# Patient Record
Sex: Male | Born: 1940 | Race: White | Hispanic: No | Marital: Married | Smoking: Never smoker
Health system: Southern US, Community
[De-identification: ages and names within clinical notes are randomized; demographics above are authoritative.]

## PROBLEM LIST (undated history)

## (undated) DIAGNOSIS — E039 Hypothyroidism, unspecified: Secondary | ICD-10-CM

## (undated) DIAGNOSIS — I10 Essential (primary) hypertension: Secondary | ICD-10-CM

## (undated) HISTORY — PX: THYROIDECTOMY, PARTIAL: SHX18

## (undated) HISTORY — DX: Essential (primary) hypertension: I10

## (undated) HISTORY — DX: Hypothyroidism, unspecified: E03.9

---

## 2015-07-15 DIAGNOSIS — N433 Hydrocele, unspecified: Secondary | ICD-10-CM | POA: Insufficient documentation

## 2016-07-16 DIAGNOSIS — K922 Gastrointestinal hemorrhage, unspecified: Secondary | ICD-10-CM | POA: Insufficient documentation

## 2016-07-16 DIAGNOSIS — N401 Enlarged prostate with lower urinary tract symptoms: Secondary | ICD-10-CM | POA: Insufficient documentation

## 2017-07-18 DIAGNOSIS — D696 Thrombocytopenia, unspecified: Secondary | ICD-10-CM | POA: Insufficient documentation

## 2018-08-13 DIAGNOSIS — I351 Nonrheumatic aortic (valve) insufficiency: Secondary | ICD-10-CM | POA: Insufficient documentation

## 2019-09-15 DIAGNOSIS — K219 Gastro-esophageal reflux disease without esophagitis: Secondary | ICD-10-CM | POA: Insufficient documentation

## 2019-09-15 DIAGNOSIS — K5981 Ogilvie syndrome: Secondary | ICD-10-CM | POA: Insufficient documentation

## 2019-11-07 DIAGNOSIS — R911 Solitary pulmonary nodule: Secondary | ICD-10-CM | POA: Insufficient documentation

## 2020-07-29 DIAGNOSIS — E785 Hyperlipidemia, unspecified: Secondary | ICD-10-CM | POA: Insufficient documentation

## 2020-07-29 DIAGNOSIS — I10 Essential (primary) hypertension: Secondary | ICD-10-CM | POA: Insufficient documentation

## 2021-02-20 NOTE — Progress Notes (Signed)
[facility] [address] [place] [address] Phone: [phone] Subjective:    [name], am serving as a scribe for Dr. Antoine Primas. This visit occurred during the SARS-CoV-2 public health emergency.  Safety protocols were in place, including screening questions prior to the visit, additional usage of staff PPE, and extensive cleaning of exam room while observing appropriate contact time as indicated for disinfecting solutions.   CC: low back pain   ION:[id number]  Collin Barnes is a 81 y.o. male coming in with complaint of back pain. Patient states that he had shooting pain in right side down to foot a month ago after washing a car. Went into UC and and got prednisone and Tramadol. Did some physical therapy before moving here one week ago. Pain has improved. Is going to go more PT when he leaves for [place] later this week.  Patient does have images on his phone of the MRI.  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  This is consistent with the report patient gives me as well.  Dated from December 2022.      Social History   Socioeconomic History   Marital status: Married    Spouse name: Not on file   Number of children: Not on file   Years of education: Not on file   Highest education level: Not on file  Occupational History   Not on file  Tobacco Use   Smoking status: Not on file   Smokeless tobacco: Not on file  Substance and Sexual Activity   Alcohol use: Not on file   Drug use: Not on file   Sexual activity: Not on file  Other Topics Concern   Not on file  Social History Narrative   Not on file   Social Determinants of Health   Financial Resource Strain: Not on file  Food Insecurity: Not on file  Transportation Needs: Not on file  Physical Activity: Not on file  Stress: Not on file  Social Connections: Not on file   Not on File No  family history on file.  Current Outpatient Medications (Endocrine & Metabolic):    levothyroxine (SYNTHROID) 75 MCG tablet, Take 75 mcg by mouth daily before breakfast.  Current Outpatient Medications (Cardiovascular):    atenolol (TENORMIN) 50 MG tablet, Take 50 mg by mouth daily.   hydrochlorothiazide (HYDRODIURIL) 25 MG tablet, Take 25 mg by mouth daily.   rosuvastatin (CRESTOR) 10 MG tablet, Take 10 mg by mouth daily.   Current Outpatient Medications (Analgesics):    traMADol (ULTRAM) 50 MG tablet, Take 1 tablet (50 mg total) by mouth every 12 (twelve) hours as needed for up to 5 days.  Current Outpatient Medications (Hematological):    FERROUS SULFATE PO, Take by mouth.   vitamin B-12 (CYANOCOBALAMIN) 1000 MCG tablet, Take 1,000 mcg by mouth daily.  Current Outpatient Medications (Other):    Apoaequorin (PREVAGEN PO), Take by mouth.   Biotin 1000 MCG CHEW, Chew by mouth.   Calcium Carb-Cholecalciferol (CALCIUM/VITAMIN D PO), Take by mouth.   chlordiazePOXIDE (LIBRIUM) 10 MG capsule, Take 10 mg by mouth 3 (three) times daily as needed for anxiety.   diphenhydrAMINE (SOMINEX) 25 MG tablet, Take 25 mg by mouth at bedtime as needed for sleep.   finasteride (PROSCAR) 5 MG tablet, Take 5 mg by mouth daily.   gabapentin (NEURONTIN) 100 MG capsule, Take 1 capsule (100 mg total)  by mouth at bedtime.   Lactobacillus-Inulin (CULTURELLE DIGESTIVE DAILY PO), Take by mouth.   Lutein 40 MG CAPS, Take by mouth.   Multiple Vitamin (MULTIVITAMIN ADULT PO), Take by mouth.   Omega-3 Fatty Acids (FISH OIL) 1000 MG CAPS, Take by mouth.   omeprazole (PRILOSEC) 40 MG capsule, Take 40 mg by mouth daily.   tamsulosin (FLOMAX) 0.4 MG CAPS capsule, Take 0.4 mg by mouth.   vitamin C (ASCORBIC ACID) 500 MG tablet, Take 500 mg by mouth daily.   Reviewed prior external information including notes and imaging from  primary care provider As well as notes that were available from care everywhere and other  healthcare systems.  Past medical history, social, surgical and family history all reviewed in electronic medical record.  No pertanent information unless stated regarding to the chief complaint.   Review of Systems:  No headache, visual changes, nausea, vomiting, diarrhea, constipation, dizziness, abdominal pain, skin rash, fevers, chills, night sweats, weight loss, swollen lymph nodes, body aches, joint swelling, chest pain, shortness of breath, mood changes. POSITIVE muscle aches  Objective  Blood pressure 124/78, pulse 74, height 5' 8.5" (1.74 m), weight 206 lb (93.4 kg), SpO2 97 %.   General: No apparent distress alert and oriented x3 mood and affect normal, dressed appropriately.  HEENT: Pupils equal, extraocular movements intact  Respiratory: Patient's speak in full sentences and does not appear short of breath  Cardiovascular: No lower extremity edema, non tender, no erythema  Gait normal with good balance and coordination.  MSK:   Patient back exam does have some loss of lordosis.  Some degenerative scoliosis noted.  Patient does have tightness with straight leg test.  Neurovascularly intact distally.   [id number]; 15 additional minutes spent for Therapeutic exercises as stated in above notes.  This included exercises focusing on stretching, strengthening, with significant focus on eccentric aspects.   Long term goals include an improvement in range of motion, strength, endurance as well as avoiding reinjury. Patient's frequency would include in 1-2 times a day, 3-5 times a week for a duration of 6-12 weeks. Low back exercises that included:  Pelvic tilt/bracing instruction to focus on control of the pelvic girdle and lower abdominal muscles  Glute strengthening exercises, focusing on proper firing of the glutes without engaging the low back muscles Proper stretching techniques for maximum relief for the hamstrings, hip flexors, low back and some rotation where tolerated  Proper technique  shown and discussed handout in great detail with ATC.  All questions were discussed and answered.      Impression and Recommendations:     The above documentation has been reviewed and is accurate and complete Judi Saa, DO

## 2021-02-23 ENCOUNTER — Encounter: Payer: Self-pay | Admitting: Family Medicine

## 2021-02-23 ENCOUNTER — Other Ambulatory Visit: Payer: Self-pay

## 2021-02-23 ENCOUNTER — Ambulatory Visit (INDEPENDENT_AMBULATORY_CARE_PROVIDER_SITE_OTHER): Payer: Medicare Other | Admitting: Family Medicine

## 2021-02-23 VITALS — BP 124/78 | HR 74 | Ht 68.5 in | Wt 206.0 lb

## 2021-02-23 DIAGNOSIS — M48062 Spinal stenosis, lumbar region with neurogenic claudication: Secondary | ICD-10-CM

## 2021-02-23 DIAGNOSIS — M5416 Radiculopathy, lumbar region: Secondary | ICD-10-CM

## 2021-02-23 DIAGNOSIS — M545 Low back pain, unspecified: Secondary | ICD-10-CM | POA: Diagnosis not present

## 2021-02-23 MED ORDER — TRAMADOL HCL 50 MG PO TABS: 50.0000 mg | ORAL_TABLET | Freq: Two times a day (BID) | ORAL | 0 refills | Status: AC | PRN

## 2021-02-23 MED ORDER — GABAPENTIN 100 MG PO CAPS: 100.0000 mg | ORAL_CAPSULE | Freq: Every day | ORAL | 3 refills | Status: DC

## 2021-02-23 NOTE — Patient Instructions (Addendum)
Vit D 2000IU daily Gabapentin 100mg  at night Tramadol only take 1/2 tab if possible Exercises  See me when you get back into town Epidural [phone] [facility]

## 2021-02-23 NOTE — Assessment & Plan Note (Addendum)
Patient is having lumbar spinal stenosis.  Has been recently moved here.  Did check patient in the database and has been using tramadol appropriately.  Discussed with patient with his age and fall risk do not want to make this a long-term medication.  Discussed with him that physical therapy would be more beneficial.  I also think that possible epidural would help well with the pain as well.  Epidural ordered today, given a 5-day dose of the tramadol and encouraged him to try half a pill instead of a full pill if necessary.  [place] the next 2 months and we will see how patient does with physical therapy down there.  Patient knows if any worsening symptoms to seek medical attention.  Patient is in agreement with the plan, work with athletic trainer today to learn some home exercises in the interim.  Follow-up with me again in 3 months when he returns to [facility]  Patient does have the MRI report and images on his phone that we did evaluate today that is consistent with moderate to severe spinal stenosis at the L5-S1 area.

## 2021-02-25 ENCOUNTER — Other Ambulatory Visit: Payer: Self-pay | Admitting: Family Medicine

## 2021-02-25 ENCOUNTER — Ambulatory Visit
Admission: RE | Admit: 2021-02-25 | Discharge: 2021-02-25 | Disposition: A | Discharge status: Home / Self Care | Payer: Medicare Other | Source: Ambulatory Visit | Attending: Family Medicine | Admitting: Family Medicine

## 2021-02-25 ENCOUNTER — Ambulatory Visit: Payer: Self-pay | Admitting: Family Medicine

## 2021-02-25 ENCOUNTER — Other Ambulatory Visit: Payer: Self-pay

## 2021-02-25 DIAGNOSIS — M5416 Radiculopathy, lumbar region: Secondary | ICD-10-CM

## 2021-02-25 IMAGING — XA DG EPIDURAL NERVE ROOT
2 series · 2 of 2 positions shown · non-contrast
Comparison: none

CLINICAL DATA: Lumbosacral spondylosis without myelopathy with
radiculopathy. Right lateral hip and leg pain to the foot for the
past 2 months. Moderate to severe spinal canal and right
neuroforaminal stenosis at L5-S1. Prior L4-L5 decompression. No
prior injections.

[Series 1: ortho standard · 1 of 1 slices shown (1 of 2)]
[im 1/1]
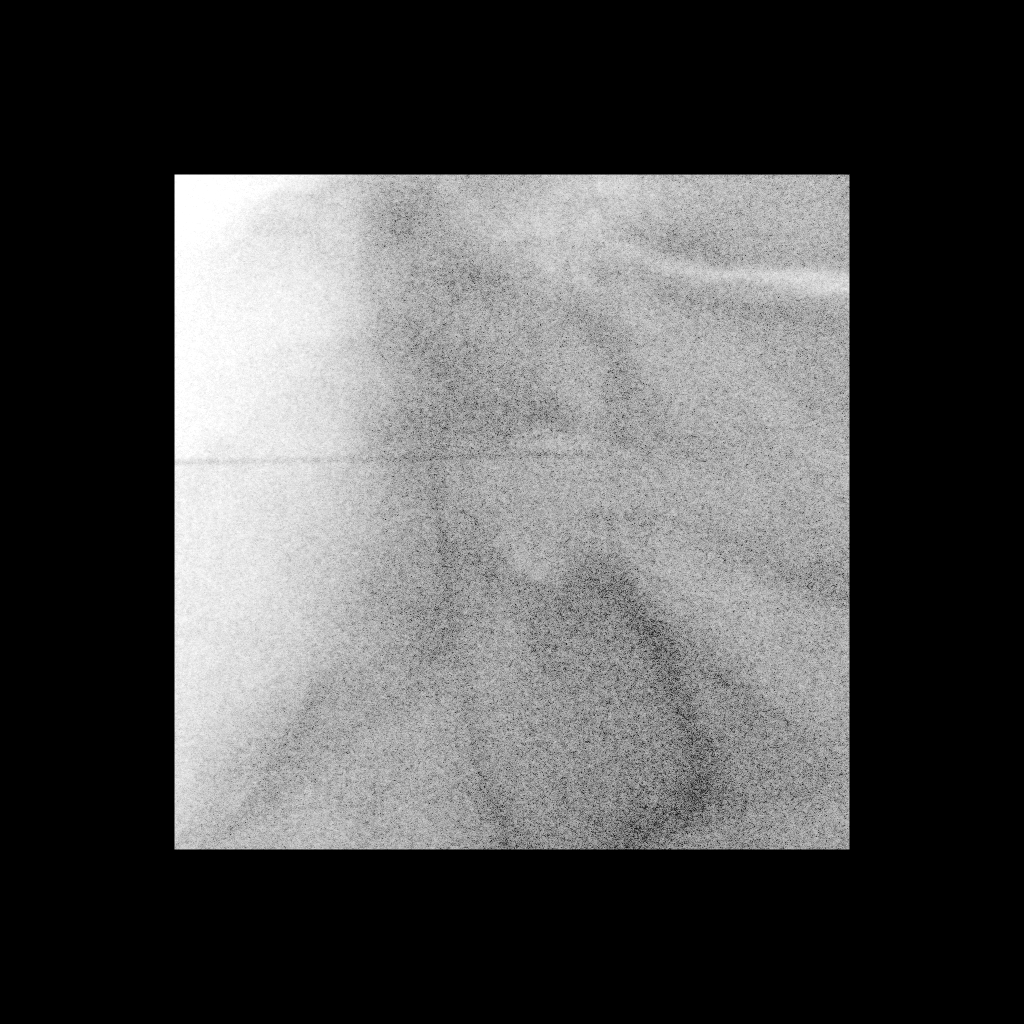

[Series 2: ortho standard · 1 of 1 slices shown (2 of 2)]
[im 1/1]
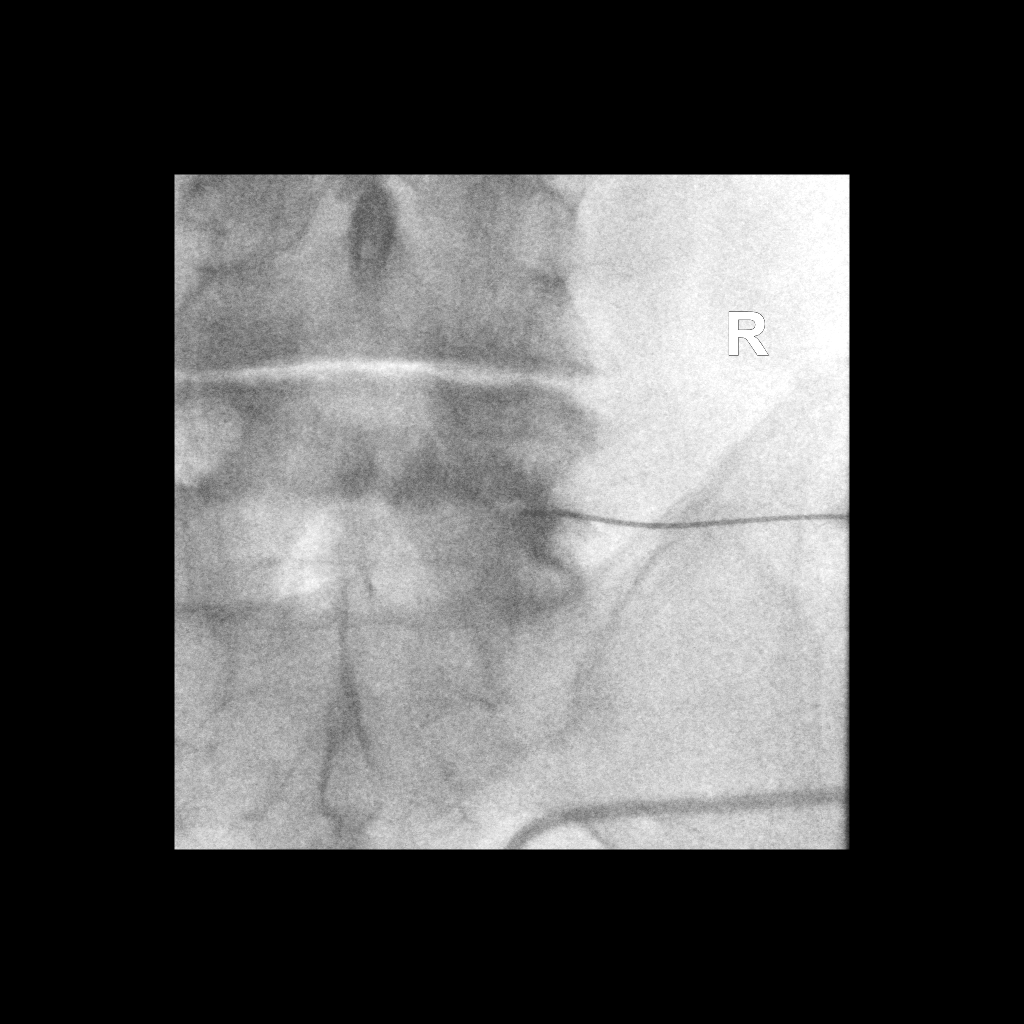

[2 of 2 positions shown; findings below may reference images not displayed]

EXAM:
EPIDURAL/NERVE ROOT

FLUOROSCOPY TIME:  Radiation Exposure Index (as provided by the
fluoroscopic device): 3.6 mGy

Fluoroscopy Time:  22 seconds

Number of Acquired Images:  0

PROCEDURE:
The procedure, risks, benefits, and alternatives were explained to
the patient. Questions regarding the procedure were encouraged and
answered. The patient understands and consents to the procedure.

RIGHT L5 NERVE ROOT BLOCK AND TRANSFORAMINAL EPIDURAL: A posterior
oblique approach was taken to the intervertebral foramen on the
right at L5-S1 using a curved 5 inch 22 gauge spinal needle.
Injection of Isovue M 200 outlined the right L5 nerve root and
showed good epidural spread. No vascular opacification is seen. 80
mg of Depo-Medrol mixed with 2 mL of 1% lidocaine were instilled.
The procedure was well-tolerated, and the patient was discharged
thirty minutes following the injection in good condition.

COMPLICATIONS:
None immediate.
IMPRESSION: Technically successful injection consisting of a right L5 nerve root
block and transforaminal epidural.

## 2021-02-25 MED ORDER — METHYLPREDNISOLONE ACETATE 40 MG/ML INJ SUSP (RADIOLOG
80.0000 mg | Freq: Once | INTRAMUSCULAR | Status: AC
  Administered 2021-02-25: 80 mg via EPIDURAL

## 2021-02-25 MED ORDER — IOPAMIDOL (ISOVUE-M 200) INJECTION 41%
1.0000 mL | Freq: Once | INTRAMUSCULAR | Status: AC
  Administered 2021-02-25: 1 mL via EPIDURAL

## 2021-02-25 NOTE — Discharge Instructions (Signed)

## 2021-04-30 NOTE — Progress Notes (Signed)
?Cardiology Office Note:   ? ?Date:  05/04/2021  ? ?ID:  Collin Barnes, DOB Jan 22, 1941, MRN [id number] ? ?PCP:  Pcp, No  ?Cardiologist:  Buford Dresser, MD ? ?Referring MD: No ref. provider found  ? ?CC: New patient evaluation for hypertension, hyperlipidemia ? ?History of Present Illness:   ? ?Collin Barnes is a 81 y.o. 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 ? ?Records Reviewed: ?Labs 07/21/20 ?Lipids: Tchol 116, LDL 48, HDL 57, TG 53 ?  ?Labs 08/01/19 ?Lipids: Tchol 187, HDL 58, LDL 111, TG 92 ?CBC: WBC 4.9, H/H 12.7/38.9, Plt 89 ?  ?Med list: ?Atenolol 50 mg daily ?Omega-3 ?HCTZ 25 mg daily ?Rosuvastatin 10 mg daily ? 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  ?Last Visit: Dr. Chrisandra Carota, [facility], 07/28/20. Records request from Care Everywhere ?BP 98/50, lower than typical. Noted coronary and aortic plaque on prior CT, started on rosuvastatin. Recommended no aspirin 2/2 history of gastric surgery/bleeding risk. Stress echo ordered. ? ?Cardiovascular risk factors: ?Prior clinical ASCVD:  ?Comorbid conditions: Hypertension - He usually notices average readings such as 130/70 at other clinic visits, including when he goes for B12 injection once a month. Today his BP is 90/50, which he notes is low and atypical for him. Remotely had systolic pressures around 158 during physicals in the [employer]. Hyperlipidemia - started on rosuvastatin 07/2020.  ?Metabolic syndrome/Obesity: ?Chronic inflammatory conditions: ?Tobacco use history: ?Family history: ?Prior cardiac testing and/or incidental findings on other testing (ie coronary calcium): CT chest 07/21/20, Echo 07/21/20, Echo 09/06/19, Stress  echo 07/29/20 ?Exercise level: Normally his resting heart rate is 60-65 bpm. Uses the treadmill for 30 mins, 3-4 days a week. Afterwards he may have some fatigue, but he does not feel exhausted. Enjoys golf 3 days a week. Also performs general back exercises, some weight lifting. He used to be very lightheaded in the past, not much lately. He would feel lightheadedness for a few steps when standing up after sitting and reading the paper. ?Current diet: "I eat anything." His wife generally cooks healthy meals, including salmon. ? ?Every once in a while he notices a strange feeling with his heart, he would not describe this as a "pause," difficult to describe. Typically a short duration. He notes that this has been ongoing for years and generally not bothersome. ? ?Early December 2022 he suffered a back injury with pinched sciatic nerve. Treated with epidural and PT. He has recovered well. ? ?He denies any chest pain, shortness of breath, or peripheral edema. No headaches, syncope, orthopnea, or PND. ? ?History reviewed. No pertinent past medical history. ? ?No past surgical history on file. ? ?Current Medications: ?Current Outpatient Medications on File Prior to Visit  ?Medication Sig  ? Apoaequorin (PREVAGEN PO) Take by mouth.  ? Biotin 1000 MCG CHEW Chew by mouth.  ? Calcium Carb-Cholecalciferol (CALCIUM/VITAMIN D PO) Take by mouth.  ? chlordiazePOXIDE (LIBRIUM) 10 MG capsule Take 10 mg by mouth 3 (three) times daily as needed for anxiety.  ? diphenhydrAMINE (SOMINEX) 25 MG tablet Take 25 mg by mouth at bedtime as needed for sleep.  ? FERROUS SULFATE PO Take by mouth.  ? finasteride (PROSCAR) 5 MG tablet Take 5 mg by mouth daily.  ? gabapentin (NEURONTIN) 100 MG  capsule Take 1 capsule (100 mg total) by mouth at bedtime.  ? hydrochlorothiazide (HYDRODIURIL) 25 MG tablet Take 25 mg by mouth daily.  ? Lactobacillus-Inulin (CULTURELLE DIGESTIVE DAILY PO) Take by mouth.  ? levothyroxine (SYNTHROID) 75 MCG tablet Take 75  mcg by mouth daily before breakfast.  ? Lutein 40 MG CAPS Take by mouth.  ? Multiple Vitamin (MULTIVITAMIN ADULT PO) Take by mouth.  ? Omega-3 Fatty Acids (FISH OIL) 1000 MG CAPS Take by mouth.  ? omeprazole (PRILOSEC) 40 MG capsule Take 40 mg by mouth daily.  ? rosuvastatin (CRESTOR) 10 MG tablet Take 10 mg by mouth daily.  ? tamsulosin (FLOMAX) 0.4 MG CAPS capsule Take 0.4 mg by mouth.  ? vitamin B-12 (CYANOCOBALAMIN) 1000 MCG tablet Take 1,000 mcg by mouth daily.  ? vitamin C (ASCORBIC ACID) 500 MG tablet Take 500 mg by mouth daily.  ? ?No current facility-administered medications on file prior to visit.  ?  ? ?Allergies:   Patient has no allergy information on record.  ? ?Social History  ? ?Tobacco Use  ? Smoking status: Never  ? Smokeless tobacco: Never  ?Substance Use Topics  ? Alcohol use: Never  ? Drug use: Never  ? ? ?Family History: ?family history is not on file. ? ?ROS:   ?Please see the history of present illness.  Additional pertinent ROS: ?Constitutional: Negative for chills, fever, night sweats, unintentional weight loss. Positive for mild fatigue.  ?HENT: Negative for ear pain and hearing loss.   ?Eyes: Negative for loss of vision and eye pain.  ?Respiratory: Negative for cough, sputum, wheezing.   ?Cardiovascular: See HPI. ?Gastrointestinal: Negative for abdominal pain, melena, and hematochezia.  ?Genitourinary: Negative for dysuria and hematuria.  ?Musculoskeletal: Negative for falls and myalgias.  ?Skin: Negative for itching and rash.  ?Neurological: Negative for focal weakness, focal sensory changes and loss of consciousness.  ?Endo/Heme/Allergies: Does not bruise/bleed easily.   ? ? ?EKGs/Labs/Other Studies Reviewed:   ? ?The following studies were reviewed today: ? ?Stress echo 07/29/20 ?Final Impressions:  ? 1. Maximum stress test with 85.8% of age predicted maximum heart rate achieved.  ? 2. During stress exam the patient developed fatigue.  ? 3. There were no ischemic changes by EKG during  stress.  ? 4. Post stress, decreased left ventricular size, increased global systolic function with an estimated EF of >75%.  ? 5. Negative stress echo for ischemia.  ? ?CT chest 07/21/20 ?1. A 9.5 mm noncalcified nodule lower lobe right lung and  a 5.7 mm subpleural noncalcified nodule lower lobe right lung; stable when compared to 09/15/2019; followup chest CT in 6 months duration suggested.  ?2. Tiny calcified granuloma right middle lobe ;stable in appearance.  ?3. Status post cholecystectomy and gastric bypass surgery.  ?4. Coronary artery calcifications.  ? ?Echo 07/21/2020 ?Final Impressions: ?1. Normal LV size, borderline wall thickness, estimated EF of 55 - 60%. ?2. Moderately enlarged left atrium. ?3. The aortic valve is normal and trileaflet, no stenosis and mild regurgitation. ?4. No significant valve disease detected. ?  ?Comparison ?Compared to prior exam of 09/06/19, there has been no significant change. ? ?Echo 09/06/2019 ?Final Impressions: ? 1. Normal LV size, normal global systolic function with an estimated EF of 70 - 75%. ? 2. Right ventricular cavity size is normal, global systolic RV function is normal. ? 3. Moderately enlarged left atrium. ? 4. The aortic valve is trileaflet and sclerotic, no stenosis and mild to moderate regurgitation. ?  ?Comparison ?Compared to  prior exam of 08/10/18, there has been no significant change. ? ? ?EKG:  EKG is personally reviewed.   ?05/04/2021: sinus bradycardia/sinus arrhythmia at 49 bpm ? ?Recent Labs: ?No results found for requested labs within last 8760 hours.  ? ?Recent Lipid Panel ?No results found for: CHOL, TRIG, HDL, CHOLHDL, VLDL, LDLCALC, LDLDIRECT ? ?Physical Exam:   ? ?VS:  BP (!) 90/50 (BP Location: Right Arm, Patient Position: Sitting, Cuff Size: Large)   Pulse (!) 49   Ht 5' 8.5" (1.74 m)   Wt 207 lb 3.2 oz (94 kg)   BMI 31.05 kg/m?    ? ?Wt Readings from Last 3 Encounters:  ?05/04/21 207 lb 3.2 oz (94 kg)  ?02/23/21 206 lb (93.4 kg)  ?  ?GEN:  Well nourished, well developed in no acute distress ?HEENT: Normal, moist mucous membranes ?NECK: No JVD ?CARDIAC: regular rhythm, normal S1 and S2, no rubs or gallops. No murmur. ?VASCULAR: Radial and DP puls

## 2021-05-04 ENCOUNTER — Other Ambulatory Visit: Payer: Self-pay

## 2021-05-04 ENCOUNTER — Encounter (HOSPITAL_BASED_OUTPATIENT_CLINIC_OR_DEPARTMENT_OTHER): Payer: Self-pay | Admitting: Cardiology

## 2021-05-04 ENCOUNTER — Ambulatory Visit (INDEPENDENT_AMBULATORY_CARE_PROVIDER_SITE_OTHER): Payer: Medicare Other | Admitting: Cardiology

## 2021-05-04 VITALS — BP 90/50 | HR 49 | Ht 68.5 in | Wt 207.2 lb

## 2021-05-04 DIAGNOSIS — R001 Bradycardia, unspecified: Secondary | ICD-10-CM

## 2021-05-04 DIAGNOSIS — I351 Nonrheumatic aortic (valve) insufficiency: Secondary | ICD-10-CM | POA: Insufficient documentation

## 2021-05-04 DIAGNOSIS — I10 Essential (primary) hypertension: Secondary | ICD-10-CM

## 2021-05-04 DIAGNOSIS — I700 Atherosclerosis of aorta: Secondary | ICD-10-CM | POA: Insufficient documentation

## 2021-05-04 DIAGNOSIS — E7800 Pure hypercholesterolemia, unspecified: Secondary | ICD-10-CM

## 2021-05-04 DIAGNOSIS — I2510 Atherosclerotic heart disease of native coronary artery without angina pectoris: Secondary | ICD-10-CM | POA: Diagnosis not present

## 2021-05-04 MED ORDER — ATENOLOL 25 MG PO TABS: 25.0000 mg | ORAL_TABLET | Freq: Every day | ORAL | 3 refills | Status: DC

## 2021-05-04 NOTE — Patient Instructions (Signed)
Medication Instructions:  ?1) DECREASE: Atenolol to 25 mg daily ?  ?*If you need a refill on your cardiac medications before your next appointment, please call your pharmacy* ? ? ?Lab Work: ?None ordered today ? ? ?Testing/Procedures: ?Your physician has requested that you have an echocardiogram in June, 2024. Echocardiography is a painless test that uses sound waves to create images of your heart. It provides your doctor with information about the size and shape of your heart and how well your heart?s chambers and valves are working. This procedure takes approximately one hour. There are no restrictions for this procedure. ?[address] ? ? ? ?Follow-Up: ?At [facility], you and your health needs are our priority.  As part of our continuing mission to provide you with exceptional heart care, we have created designated Provider Care Teams.  These Care Teams include your primary Cardiologist (physician) and Advanced Practice Providers (APPs -  Physician Assistants and Nurse Practitioners) who all work together to provide you with the care you need, when you need it. ? ?We recommend signing up for the patient portal called "MyChart".  Sign up information is provided on this After Visit Summary.  MyChart is used to connect with patients for Virtual Visits (Telemedicine).  Patients are able to view lab/test results, encounter notes, upcoming appointments, etc.  Non-urgent messages can be sent to your provider as well.   ?To learn more about what you can do with MyChart, go to [email or web address].   ? ?Your next appointment:   ?June 2024 ? ?The format for your next appointment:   ?In Person ? ?Provider:   ?Jodelle Red, MD ? ? ?how to check blood pressure: ? -sit comfortably in a chair, feet uncrossed and flat on floor, for 5-10 minutes ? -arm ideally should rest at the level of the heart. However, arm should be relaxed and not tense (for example, do not hold the arm up  unsupported) ? -avoid exercise, caffeine, and tobacco for at least 30 minutes prior to BP reading ? -don't take BP cuff reading over clothes (always place on skin directly) ? -I prefer to know how well the medication is working, so I would like you to take your readings 1-2 hours after taking your blood pressure medication if possible  ? ?Send me readings either over the phone or through mychart in a few weeks and we will see if this is the right dose for you ?

## 2021-05-05 ENCOUNTER — Ambulatory Visit: Payer: Medicare Other | Admitting: Family Medicine

## 2021-05-11 DIAGNOSIS — E039 Hypothyroidism, unspecified: Secondary | ICD-10-CM | POA: Insufficient documentation

## 2021-05-11 DIAGNOSIS — R7309 Other abnormal glucose: Secondary | ICD-10-CM | POA: Insufficient documentation

## 2021-05-11 DIAGNOSIS — Z9884 Bariatric surgery status: Secondary | ICD-10-CM | POA: Insufficient documentation

## 2021-05-11 DIAGNOSIS — F411 Generalized anxiety disorder: Secondary | ICD-10-CM | POA: Insufficient documentation

## 2021-05-18 ENCOUNTER — Ambulatory Visit (HOSPITAL_COMMUNITY)
Admission: RE | Admit: 2021-05-18 | Discharge: 2021-05-18 | Disposition: A | Discharge status: Home / Self Care | Payer: Medicare Other | Source: Ambulatory Visit | Attending: Physician Assistant | Admitting: Physician Assistant

## 2021-05-18 ENCOUNTER — Encounter (HOSPITAL_COMMUNITY): Payer: Self-pay

## 2021-05-18 VITALS — BP 116/68 | HR 60 | Temp 98.3°F | Resp 17

## 2021-05-18 DIAGNOSIS — B356 Tinea cruris: Secondary | ICD-10-CM

## 2021-05-18 LAB — POCT URINALYSIS DIPSTICK, ED / UC
Bilirubin Urine: NEGATIVE
Glucose, UA: NEGATIVE mg/dL
Hgb urine dipstick: NEGATIVE
Nitrite: NEGATIVE
Protein, ur: NEGATIVE mg/dL
Specific Gravity, Urine: 1.02 (ref 1.005–1.030)
Urobilinogen, UA: 0.2 mg/dL (ref 0.0–1.0)
pH: 5 (ref 5.0–8.0)

## 2021-05-18 MED ORDER — TERBINAFINE HCL 250 MG PO TABS: 250.0000 mg | ORAL_TABLET | Freq: Every day | ORAL | 0 refills | Status: DC

## 2021-05-18 MED ORDER — NYSTATIN 100000 UNIT/GM EX POWD: 1.0000 "application " | Freq: Three times a day (TID) | CUTANEOUS | 1 refills | Status: DC

## 2021-05-18 NOTE — ED Provider Notes (Signed)
?[facility] ? ? ? ?CSN: [id number] ?Arrival date & time: 05/18/21  1357 ? ? ?  ? ?History   ?Chief Complaint ?Chief Complaint  ?Patient presents with  ? Groin Swelling  ? APPT   ? ? ?HPI ?Collin Barnes is a 81 y.o. male.  ? ?Pt complains of a painful itching rash to his groin that started about one week ago.  He was seen in another clinic and prescribed diflucan with no improvement.  He has used lotrimin cream with no improvement.  He has been using a steroid cream that his wife had which he states helps with the itching.  He denies fever, chills, testicular swelling or pain.   ? ? ?History reviewed. No pertinent past medical history. ? ?Patient Active Problem List  ? Diagnosis Date Noted  ? 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 Coronary artery calcification seen on CT scan 05/04/2021  ? Aortic atherosclerosis (HCC) 05/04/2021  ? Pure hypercholesterolemia 05/04/2021  ? 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 ? ?History reviewed. No pertinent surgical history. ? ? ? ? ?Home Medications   ? ?Prior to Admission medications   ?Medication Sig Start Date End Date Taking? Authorizing Provider  ?nystatin (MYCOSTATIN/NYSTOP) powder Apply 1 application. topically 3 (three) times daily. 05/18/21  Yes Ward, Tylene FantasiaJessica Z, PA-C  ?terbinafine (LAMISIL) 250 MG tablet Take 1 tablet (250 mg total) by mouth daily for 14 days. 05/18/21 06/01/21 Yes Ward, Tylene FantasiaJessica Z, PA-C  ?Apoaequorin (PREVAGEN PO) Take by mouth.    [provider]  ?atenolol (TENORMIN) 25 MG tablet Take 1 tablet (25 mg total) by mouth daily. 05/04/21 04/29/22  Jodelle Redhristopher, Bridgette, MD  ?Biotin 1000 MCG CHEW Chew by mouth.    [provider]  ?Calcium Carb-Cholecalciferol (CALCIUM/VITAMIN D PO) Take by mouth.    [provider]  ?chlordiazePOXIDE (LIBRIUM) 10 MG capsule Take 10 mg by mouth 3 (three) times daily as needed for anxiety.    [provider]  ?diphenhydrAMINE (SOMINEX)  25 MG tablet Take 25 mg by mouth at bedtime as needed for sleep.    [provider]  ?FERROUS SULFATE PO Take by mouth.    [provider]  ?finasteride (PROSCAR) 5 MG tablet Take 5 mg by mouth daily.    [provider]  ?gabapentin (NEURONTIN) 100 MG capsule Take 1 capsule (100 mg total) by mouth at bedtime. 02/23/21   Judi SaaSmith, Zachary M, DO  ?hydrochlorothiazide (HYDRODIURIL) 25 MG tablet Take 25 mg by mouth daily.    [provider]  ?Lactobacillus-Inulin (CULTURELLE DIGESTIVE DAILY PO) Take by mouth.    [provider]  ?levothyroxine (SYNTHROID) 75 MCG tablet Take 75 mcg by mouth daily before breakfast.    [provider]  ?Lutein 40 MG CAPS Take by mouth.    [provider]  ?Multiple Vitamin (MULTIVITAMIN ADULT PO) Take by mouth.    [provider]  ?Omega-3 Fatty Acids (FISH OIL) 1000 MG CAPS Take by mouth.    [provider]  ?omeprazole (PRILOSEC) 40 MG capsule Take 40 mg by mouth daily.    [provider]  ?rosuvastatin (CRESTOR) 10 MG tablet Take 10 mg by mouth daily.    [provider]  ?tamsulosin (FLOMAX) 0.4 MG CAPS capsule Take 0.4 mg by mouth.    [provider]  ?vitamin B-12 (CYANOCOBALAMIN) 1000 MCG tablet Take 1,000 mcg by mouth daily.    [provider]  ?vitamin C (ASCORBIC ACID) 500 MG  tablet Take 500 mg by mouth daily.    [provider]  ? ? ?Family History ?History reviewed. No pertinent family history. ? ?Social History ?Social History  ? ?Tobacco Use  ? Smoking status: Never  ? Smokeless tobacco: Never  ?Substance Use Topics  ? Alcohol use: Never  ? Drug use: Never  ? ? ? ?Allergies   ?Patient has no known allergies. ? ? ?Review of Systems ?Review of Systems  ?Constitutional:  Negative for chills and fever.  ?HENT:  Negative for ear pain and sore throat.   ?Eyes:  Negative for pain and visual disturbance.  ?Respiratory:  Negative for cough and shortness of breath.    ?Cardiovascular:  Negative for chest pain and palpitations.  ?Gastrointestinal:  Negative for abdominal pain and vomiting.  ?Genitourinary:  Negative for dysuria and hematuria.  ?Musculoskeletal:  Negative for arthralgias and back pain.  ?Skin:  Positive for rash. Negative for color change.  ?Neurological:  Negative for seizures and syncope.  ?All other systems reviewed and are negative. ? ? ?Physical Exam ?Triage Vital Signs ?ED Triage Vitals  ?Enc Vitals Group  ?   BP 05/18/21 1422 116/68  ?   Pulse Rate 05/18/21 1422 60  ?   Resp 05/18/21 1422 17  ?   Temp 05/18/21 1422 98.3 ?F (36.8 ?C)  ?   Temp Source 05/18/21 1422 Oral  ?   SpO2 05/18/21 1422 94 %  ?   Weight --   ?   Height --   ?   Head Circumference --   ?   Peak Flow --   ?   Pain Score 05/18/21 1423 0  ?   Pain Loc --   ?   Pain Edu? --   ?   Excl. in GC? --   ? ?No data found. ? ?Updated Vital Signs ?BP 116/68   Pulse 60   Temp 98.3 ?F (36.8 ?C) (Oral)   Resp 17   SpO2 94%  ? ?Visual Acuity ?Right Eye Distance:   ?Left Eye Distance:   ?Bilateral Distance:   ? ?Right Eye Near:   ?Left Eye Near:    ?Bilateral Near:    ? ?Physical Exam ?Vitals and nursing note reviewed.  ?Constitutional:   ?   General: He is not in acute distress. ?   Appearance: He is well-developed.  ?HENT:  ?   Head: Normocephalic and atraumatic.  ?Eyes:  ?   Conjunctiva/sclera: Conjunctivae normal.  ?Cardiovascular:  ?   Rate and Rhythm: Normal rate and regular rhythm.  ?   Heart sounds: No murmur heard. ?Pulmonary:  ?   Effort: Pulmonary effort is normal. No respiratory distress.  ?   Breath sounds: Normal breath sounds.  ?Abdominal:  ?   Palpations: Abdomen is soft.  ?   Tenderness: There is no abdominal tenderness.  ?Genitourinary: ?   Comments: Erythematous rash to the groin bilaterally and testicles.  No testicular swelling.  ?Musculoskeletal:     ?   General: No swelling.  ?   Cervical back: Neck supple.  ?Skin: ?   General: Skin is warm and dry.  ?   Capillary Refill:  Capillary refill takes less than 2 seconds.  ?Neurological:  ?   Mental Status: He is alert.  ?Psychiatric:     ?   Mood and Affect: Mood normal.  ? ? ? ?UC Treatments / Results  ?Labs ?(all labs ordered are listed, but only abnormal results are displayed) ?Labs Reviewed  ?  POCT URINALYSIS DIPSTICK, ED / UC - Abnormal; Notable for the following components:  ?    Result Value  ? Ketones, ur TRACE (*)   ? Leukocytes,Ua SMALL (*)   ? All other components within normal limits  ? ? ?EKG ? ? ?Radiology ?No results found. ? ?Procedures ?Procedures (including critical care time) ? ?Medications Ordered in UC ?Medications - No data to display ? ?Initial Impression / Assessment and Plan / UC Course  ?I have reviewed the triage vital signs and the nursing notes. ? ?Pertinent labs & imaging results that were available during my care of the patient were reviewed by me and considered in my medical decision making (see chart for details). ? ?  ? ?Tinea cruris.  Nystatin power prescribed. Advised to discontinue steroid cream.  Terbinafine orally prescribed due to extent of rash.  Return precautions discussed.  ?Final Clinical Impressions(s) / UC Diagnoses  ? ?Final diagnoses:  ?Tinea cruris  ? ? ? ?Discharge Instructions   ? ?  ?Take Lamisil once daily for two weeks ?Apply nystatin power up to three times per day.  ?Discontinue steroid cream as this can make the rash worse ?Keep area clean and dry.  ?Follow up with primary care physician if no improvement  ? ? ?ED Prescriptions   ? ? Medication Sig Dispense Auth. Provider  ? nystatin (MYCOSTATIN/NYSTOP) powder Apply 1 application. topically 3 (three) times daily. 15 g Ward, Tylene Fantasia, PA-C  ? terbinafine (LAMISIL) 250 MG tablet Take 1 tablet (250 mg total) by mouth daily for 14 days. 14 tablet Ward, Tylene Fantasia, PA-C  ? ?  ? ?PDMP not reviewed this encounter. ?  ?Ward, Tylene Fantasia, PA-C ?05/18/21 1531 ? ?

## 2021-05-18 NOTE — ED Triage Notes (Addendum)
Pt is present today with left groin swelling. Pt states that the swelling and pain started 10 days ago. Pt states that he was given antibiotics and a cream from [facility] and  felt temporary relief but still inflamed. Pt states that he used his spouse topical cream and it helps.  ?

## 2021-05-18 NOTE — Discharge Instructions (Signed)
Take Lamisil once daily for two weeks ?Apply nystatin power up to three times per day.  ?Discontinue steroid cream as this can make the rash worse ?Keep area clean and dry.  ?Follow up with primary care physician if no improvement  ?

## 2021-05-21 ENCOUNTER — Telehealth (HOSPITAL_COMMUNITY): Payer: Self-pay

## 2021-05-21 DIAGNOSIS — E876 Hypokalemia: Secondary | ICD-10-CM

## 2021-05-21 NOTE — Telephone Encounter (Signed)
Received called from patient- he reports he is having an allergic reaction to the medication that was prescribed on 05/18/2021. Recommendation would be to go ER. Patient states he will not go to ER and hung up the phone. ?

## 2021-05-24 ENCOUNTER — Ambulatory Visit: Payer: Self-pay

## 2021-05-24 ENCOUNTER — Ambulatory Visit (HOSPITAL_COMMUNITY)
Admission: EM | Admit: 2021-05-24 | Discharge: 2021-05-24 | Disposition: A | Discharge status: Home / Self Care | Payer: Medicare Other | Attending: Emergency Medicine | Admitting: Emergency Medicine

## 2021-05-24 ENCOUNTER — Encounter (HOSPITAL_COMMUNITY): Payer: Self-pay | Admitting: Emergency Medicine

## 2021-05-24 DIAGNOSIS — L03116 Cellulitis of left lower limb: Secondary | ICD-10-CM | POA: Diagnosis present

## 2021-05-24 DIAGNOSIS — B372 Candidiasis of skin and nail: Secondary | ICD-10-CM | POA: Insufficient documentation

## 2021-05-24 LAB — COMPREHENSIVE METABOLIC PANEL
ALT: 71 U/L — ABNORMAL HIGH (ref 0–44)
AST: 77 U/L — ABNORMAL HIGH (ref 15–41)
Albumin: 3 g/dL — ABNORMAL LOW (ref 3.5–5.0)
Alkaline Phosphatase: 54 U/L (ref 38–126)
Anion gap: 8 (ref 5–15)
BUN: 20 mg/dL (ref 8–23)
CO2: 26 mmol/L (ref 22–32)
Calcium: 8.9 mg/dL (ref 8.9–10.3)
Chloride: 102 mmol/L (ref 98–111)
Creatinine, Ser: 1.12 mg/dL (ref 0.61–1.24)
GFR, Estimated: >60 mL/min (ref >60)
Glucose, Bld: 131 mg/dL — ABNORMAL HIGH (ref 70–99)
Potassium: 3.1 mmol/L — ABNORMAL LOW (ref 3.5–5.1)
Sodium: 136 mmol/L (ref 135–145)
Total Bilirubin: 0.6 mg/dL (ref 0.3–1.2)
Total Protein: 6.8 g/dL (ref 6.5–8.1)

## 2021-05-24 LAB — CBC WITH DIFFERENTIAL/PLATELET
Abs Immature Granulocytes: 0.37 10*3/uL — ABNORMAL HIGH (ref 0.00–0.07)
Basophils Absolute: 0 10*3/uL (ref 0.0–0.1)
Basophils Relative: 0 %
Eosinophils Absolute: 0.1 10*3/uL (ref 0.0–0.5)
Eosinophils Relative: 1 %
HCT: 31.2 % — ABNORMAL LOW (ref 39.0–52.0)
Hemoglobin: 10.5 g/dL — ABNORMAL LOW (ref 13.0–17.0)
Immature Granulocytes: 4 %
Lymphocytes Relative: 10 %
Lymphs Abs: 1 10*3/uL (ref 0.7–4.0)
MCH: 31.7 pg (ref 26.0–34.0)
MCHC: 33.7 g/dL (ref 30.0–36.0)
MCV: 94.3 fL (ref 80.0–100.0)
Monocytes Absolute: 0.8 10*3/uL (ref 0.1–1.0)
Monocytes Relative: 7 %
Neutro Abs: 7.9 10*3/uL — ABNORMAL HIGH (ref 1.7–7.7)
Neutrophils Relative %: 78 %
Platelets: UNDETERMINED 10*3/uL (ref 150–400)
RBC: 3.31 MIL/uL — ABNORMAL LOW (ref 4.22–5.81)
RDW: 13.8 % (ref 11.5–15.5)
WBC: 10.1 10*3/uL (ref 4.0–10.5)
nRBC: 0 % (ref 0.0–0.2)

## 2021-05-24 LAB — POCT URINALYSIS DIPSTICK, ED / UC
Bilirubin Urine: NEGATIVE
Glucose, UA: NEGATIVE mg/dL
Hgb urine dipstick: NEGATIVE
Ketones, ur: NEGATIVE mg/dL
Leukocytes,Ua: NEGATIVE
Nitrite: NEGATIVE
Protein, ur: 30 mg/dL — AB
Specific Gravity, Urine: 1.02 (ref 1.005–1.030)
Urobilinogen, UA: 0.2 mg/dL (ref 0.0–1.0)
pH: 5.5 (ref 5.0–8.0)

## 2021-05-24 MED ORDER — CEFTRIAXONE SODIUM 1 G IJ SOLR
INTRAMUSCULAR | Status: AC
Start: 2021-05-24 — End: ?
  Filled 2021-05-24: qty 10

## 2021-05-24 MED ORDER — DIPHENHYDRAMINE HCL 50 MG/ML IJ SOLN
INTRAMUSCULAR | Status: AC
  Filled 2021-05-24: qty 1

## 2021-05-24 MED ORDER — CEFTRIAXONE SODIUM 1 G IJ SOLR
1.0000 g | Freq: Once | INTRAMUSCULAR | Status: AC
  Administered 2021-05-24: 1 g via INTRAMUSCULAR

## 2021-05-24 MED ORDER — LIDOCAINE HCL (PF) 1 % IJ SOLN
INTRAMUSCULAR | Status: AC
  Filled 2021-05-24: qty 2

## 2021-05-24 MED ORDER — DIPHENHYDRAMINE HCL 50 MG/ML IJ SOLN
50.0000 mg | Freq: Once | INTRAMUSCULAR | Status: AC
  Administered 2021-05-24: 50 mg via INTRAMUSCULAR

## 2021-05-24 MED ORDER — SULFAMETHOXAZOLE-TRIMETHOPRIM 800-160 MG PO TABS: 1.0000 | ORAL_TABLET | Freq: Two times a day (BID) | ORAL | 0 refills | Status: AC

## 2021-05-24 MED ORDER — MUPIROCIN 2 % EX OINT: TOPICAL_OINTMENT | CUTANEOUS | 0 refills | Status: DC

## 2021-05-24 NOTE — ED Triage Notes (Signed)
Pt reports possible cellulitis on the left foot. States foot has been red and swollen since Wednesday.  ?

## 2021-05-24 NOTE — ED Provider Notes (Signed)
?[facility] ? ? ? ?CSN: [id number] ?Arrival date & time: 05/24/21  1011 ?  ? ?HISTORY  ? ?Chief Complaint  ?Patient presents with  ? possible cellulitis   ? ?HPI ?Collin Barnes is a 81 y.o. male. Patient presents urgent care today complaining of possible cellulitis on his left foot.  Patient states his foot has been red and swollen since Wednesday.  Patient also complains of a rash in his groin that has been present for a few weeks, states he is uncertain whether these 2 are related.  Patient is here with his wife who states that his face has been swollen as well, she states she is uncertain whether it is related to a nonhealing wound on the top of his head. ? ?The history is provided by the patient.  ?History reviewed. No pertinent past medical history. ?Patient Active Problem List  ? Diagnosis Date Noted  ? 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 Coronary artery calcification seen on CT scan 05/04/2021  ? Aortic atherosclerosis (HCC) 05/04/2021  ? Pure hypercholesterolemia 05/04/2021  ? 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 ?History reviewed. No pertinent surgical history. ? ?Home Medications   ? ?Prior to Admission medications   ?Medication Sig Start Date End Date Taking? Authorizing Provider  ?Apoaequorin (PREVAGEN PO) Take by mouth.    [provider]  ?atenolol (TENORMIN) 25 MG tablet Take 1 tablet (25 mg total) by mouth daily. 05/04/21 04/29/22  Jodelle Red, MD  ?Biotin 1000 MCG CHEW Chew by mouth.    [provider]  ?Calcium Carb-Cholecalciferol (CALCIUM/VITAMIN D PO) Take by mouth.    [provider]  ?chlordiazePOXIDE (LIBRIUM) 10 MG capsule Take 10 mg by mouth 3 (three) times daily as needed for anxiety.    [provider]  ?diphenhydrAMINE (SOMINEX) 25 MG tablet Take 25 mg by mouth at bedtime as needed for sleep.    [provider]  ?FERROUS SULFATE PO Take by mouth.    [provider]  ?finasteride (PROSCAR) 5 MG tablet Take 5 mg by mouth daily.    [provider]  ?gabapentin (NEURONTIN) 100 MG capsule Take 1 capsule (100 mg total) by mouth at bedtime. 02/23/21   Judi Saa, DO  ?hydrochlorothiazide (HYDRODIURIL) 25 MG tablet Take 25 mg by mouth daily.    [provider]  ?Lactobacillus-Inulin (CULTURELLE DIGESTIVE DAILY PO) Take by mouth.    [provider]  ?levothyroxine (SYNTHROID) 75 MCG tablet Take 75 mcg by mouth daily before breakfast.    [provider]  ?Lutein 40 MG CAPS Take by mouth.    [provider]  ?Multiple Vitamin (MULTIVITAMIN ADULT PO) Take by mouth.    [provider]  ?nystatin (MYCOSTATIN/NYSTOP) powder Apply 1 application. topically 3 (three) times daily. 05/18/21   Ward, Tylene Fantasia, PA-C  ?Omega-3 Fatty Acids (FISH OIL) 1000 MG CAPS Take by mouth.    [provider]  ?omeprazole (PRILOSEC) 40 MG capsule Take 40 mg by mouth daily.    [provider]  ?rosuvastatin (CRESTOR) 10 MG tablet Take 10 mg by mouth daily.    [provider]  ?tamsulosin (FLOMAX) 0.4 MG CAPS capsule Take 0.4 mg by mouth.    [provider]  ?terbinafine (LAMISIL) 250 MG tablet Take 1 tablet (250 mg total) by mouth daily for 14 days. 05/18/21 06/01/21  Ward, Tylene Fantasia, PA-C  ?vitamin B-12 (CYANOCOBALAMIN) 1000 MCG tablet Take 1,000 mcg by mouth daily.  [provider]  ?vitamin C (ASCORBIC ACID) 500 MG tablet Take 500 mg by mouth daily.    [provider]  ? ? ?Family History ?History reviewed. No pertinent family history. ?Social History ?Social History  ? ?Tobacco Use  ? Smoking status: Never  ? Smokeless tobacco: Never  ?Substance Use Topics  ? Alcohol use: Never  ? Drug use: Never  ? ?Allergies   ?Patient has no known allergies. ? ?Review of Systems ?Review of Systems ?Pertinent findings noted in history of present illness.  ? ?Physical Exam ?Triage Vital Signs ?ED  Triage Vitals  ?Enc Vitals Group  ?   BP 12/05/20 0827 (!) 147/82  ?   Pulse Rate 12/05/20 0827 72  ?   Resp 12/05/20 0827 18  ?   Temp 12/05/20 0827 98.3 ?F (36.8 ?C)  ?   Temp Source 12/05/20 0827 Oral  ?   SpO2 12/05/20 0827 98 %  ?   Weight --   ?   Height --   ?   Head Circumference --   ?   Peak Flow --   ?   Pain Score 12/05/20 0826 5  ?   Pain Loc --   ?   Pain Edu? --   ?   Excl. in GC? --   ?No data found. ? ?Updated Vital Signs ?BP 111/66 (BP Location: Right Arm)   Pulse 62   Temp 98.3 ?F (36.8 ?C) (Oral)   Resp 16   Ht 5\' 8"  (1.727 m)   Wt 207 lb 3.2 oz (94 kg)   SpO2 95%   BMI 31.50 kg/m?  ? ?Physical Exam ?Vitals and nursing note reviewed.  ?Constitutional:   ?   General: He is not in acute distress. ?   Appearance: Normal appearance. He is not ill-appearing.  ?HENT:  ?   Head: Normocephalic and atraumatic.  ?Eyes:  ?   General: Lids are normal.     ?   Right eye: No discharge.     ?   Left eye: No discharge.  ?   Extraocular Movements: Extraocular movements intact.  ?   Conjunctiva/sclera: Conjunctivae normal.  ?   Right eye: Right conjunctiva is not injected.  ?   Left eye: Left conjunctiva is not injected.  ?Neck:  ?   Trachea: Trachea and phonation normal.  ?Cardiovascular:  ?   Rate and Rhythm: Normal rate and regular rhythm.  ?   Pulses: Normal pulses.  ?   Heart sounds: Normal heart sounds. No murmur heard. ?  No friction rub. No gallop.  ?Pulmonary:  ?   Effort: Pulmonary effort is normal. No accessory muscle usage, prolonged expiration or respiratory distress.  ?   Breath sounds: Normal breath sounds. No stridor, decreased air movement or transmitted upper airway sounds. No decreased breath sounds, wheezing, rhonchi or rales.  ?Chest:  ?   Chest wall: No tenderness.  ?Musculoskeletal:     ?   General: Normal range of motion.  ?   Cervical back: Normal range of motion and neck supple. Normal range of motion.  ?Lymphadenopathy:  ?   Cervical: No cervical adenopathy.  ?Skin: ?   General:  Skin is warm and dry.  ?   Findings: Erythema (Edema and erythema bilateral sides of face, mild swelling of left eyelid as well with erythema) and lesion (Top of left foot, central punctate that is not fluctuant, area is indurated, warm to touch and erythematous) present. No rash.  ?  Comments: Patient has a nonhealing wound on top of his head.  ?Neurological:  ?   General: No focal deficit present.  ?   Mental Status: He is alert and oriented to person, place, and time.  ?Psychiatric:     ?   Mood and Affect: Mood normal.     ?   Behavior: Behavior normal.  ? ? ?Visual Acuity ?Right Eye Distance:   ?Left Eye Distance:   ?Bilateral Distance:   ? ?Right Eye Near:   ?Left Eye Near:    ?Bilateral Near:    ? ?UC Couse / Diagnostics / Procedures:  ?  ?EKG ? ?Radiology ?No results found. ? ?Procedures ?Procedures (including critical care time) ? ?UC Diagnoses / Final Clinical Impressions(s)   ?I have reviewed the triage vital signs and the nursing notes. ? ?Pertinent labs & imaging results that were available during my care of the patient were reviewed by me and considered in my medical decision making (see chart for details).   ? ?Final diagnoses:  ?Cellulitis of left lower extremity  ?Candidiasis of skin  ? ?Believe it certainly possible the patient has cellulitis not only in his foot but also top of his head.  We will treat him aggressively with injection of a gram of ceftriaxone and a 7-day course of Bactrim.  Patient reports he had good response to nystatin powder in his groin area, recommend patient continue using this.  Urinalysis today was normal, labs to be performed as well. ? ?ED Prescriptions   ? ? Medication Sig Dispense Auth. Provider  ? sulfamethoxazole-trimethoprim (BACTRIM DS) 800-160 MG tablet Take 1 tablet by mouth 2 (two) times daily for 7 days. 14 tablet Theadora RamaMorgan, Brighten Orndoff Scales, PA-C  ? mupirocin ointment (BACTROBAN) 2 % For decolonization, apply with cotton tip applicator to each nare once daily for  7 days.  For abscess treatment, apply to affected area twice daily for 10 days. 30 g Theadora RamaMorgan, Saqib Cazarez Scales, PA-C  ? ?  ? ?PDMP not reviewed this encounter. ? ?Pending results:  ?Labs Reviewed  ?CBC WITH DIFFERENTIAL/PLAT

## 2021-05-24 NOTE — Discharge Instructions (Addendum)
The lesion on the left foot is concerning for cellulitis.  For this, you were treated with an injection of ceftriaxone, and antibiotic, and provided with a prescription for an oral dose of the second antibiotic, Bactrim.  Please take 1 tablet twice daily for the next 7 days. ? ?At this time, it is difficult for me to discern with 100% confidence whether the redness in your face is due to an allergic reaction or due to some possible cellulitis related to the lesion on the top of your head.  You were provided with an injection of Benadryl which will be a good test for this.  If the Benadryl does not significantly relieve the redness and swelling in your face for the next few hours, I believe that we can safely assume that this is related to the lesion.  Of your head which may be infected as well.  Fortunately, the 2 antibiotics, ceftriaxone and Bactrim, will treat this as well. ? ?Further testing performed today including urinalysis, complete blood cell count and a complete metabolic panel will also be useful in determining how much of what is going on is infectious and how much of what is going on is reactive (allergic).  These results should be available to your MyChart within the next 12 to 24 hours.  Any abnormal findings will be called to you by phone along with recommendations for treatment, if any.  Your results will also be made available to your [facility] health provider. ? ?Please continue to monitor the border of the wound on your left foot for signs of increasing.  If the redness exceeds the outline drawn, please report to the emergency room soon as possible for further evaluation. ? 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  Decolonization is recommended and the process is fairly simple.  You will be provided with an antibiotic ointment that I want you to apply to the inside of your nose daily for the next 7 days using a  Q-tip.  After 7 days, I want you to perform a bleach bath.  Please fill your standard size bathtub with warm water (not hot), add 1/4 to 1/3 cup of bleach.  Soak your body in the water excepting your face for 20 minutes, then stand and using soap and water wash the bleach product from your body.  Pat your skin dry and moisturize well using a hypoallergenic lotion such as Eucerin, Lubriderm, Cetaphil. ? ?Please feel free to continue to use nystatin in your groin area for the redness and the itching.  I have renewed your prescription for nystatin powder and provided you with a nystatin cream as well.  Please continue to monitor this area for worsening redness, itching and pain. ? ?Thank you for visiting urgent care today.  I hope that this is your last but I am certainly happy to see you anytime if you have further concerns.  I personally work at the [employer] location at [employer]  There are Mondays through Fridays from 8 - 4. ? ?It was a pleasure meeting you.  I hope you have significant relief of your symptoms very soon and enjoy your next round of golf. ?

## 2021-05-25 MED ORDER — POTASSIUM CHLORIDE CRYS ER 20 MEQ PO TBCR: 20.0000 meq | EXTENDED_RELEASE_TABLET | Freq: Two times a day (BID) | ORAL | 0 refills | Status: DC

## 2021-05-27 ENCOUNTER — Other Ambulatory Visit (HOSPITAL_BASED_OUTPATIENT_CLINIC_OR_DEPARTMENT_OTHER): Payer: Self-pay | Admitting: Nurse Practitioner

## 2021-05-27 ENCOUNTER — Encounter (HOSPITAL_BASED_OUTPATIENT_CLINIC_OR_DEPARTMENT_OTHER): Payer: Self-pay | Admitting: Nurse Practitioner

## 2021-05-27 DIAGNOSIS — E538 Deficiency of other specified B group vitamins: Secondary | ICD-10-CM | POA: Insufficient documentation

## 2021-05-27 DIAGNOSIS — T50905A Adverse effect of unspecified drugs, medicaments and biological substances, initial encounter: Secondary | ICD-10-CM | POA: Insufficient documentation

## 2021-05-27 DIAGNOSIS — R21 Rash and other nonspecific skin eruption: Secondary | ICD-10-CM | POA: Insufficient documentation

## 2021-06-04 ENCOUNTER — Ambulatory Visit: Payer: Medicare Other | Admitting: Podiatry

## 2021-06-18 ENCOUNTER — Other Ambulatory Visit (HOSPITAL_BASED_OUTPATIENT_CLINIC_OR_DEPARTMENT_OTHER): Payer: Self-pay | Admitting: Nurse Practitioner

## 2021-06-18 ENCOUNTER — Other Ambulatory Visit (HOSPITAL_BASED_OUTPATIENT_CLINIC_OR_DEPARTMENT_OTHER): Payer: Self-pay

## 2021-06-18 DIAGNOSIS — U071 COVID-19: Secondary | ICD-10-CM

## 2021-06-18 MED ORDER — NIRMATRELVIR&RITONAVIR 300/100 20 X 150 MG & 10 X 100MG PO TBPK
3.0000 | ORAL_TABLET | Freq: Two times a day (BID) | ORAL | 0 refills | Status: AC
  Filled 2021-06-18: qty 30, 5d supply, fill #0

## 2021-07-14 ENCOUNTER — Ambulatory Visit: Payer: Medicare Other | Admitting: Family Medicine

## 2021-07-14 ENCOUNTER — Encounter: Payer: Self-pay | Admitting: Emergency Medicine

## 2021-07-14 ENCOUNTER — Ambulatory Visit (INDEPENDENT_AMBULATORY_CARE_PROVIDER_SITE_OTHER): Payer: Medicare Other | Admitting: Emergency Medicine

## 2021-07-14 VITALS — BP 110/68 | HR 75 | Temp 97.8°F | Ht 68.0 in | Wt 205.0 lb

## 2021-07-14 DIAGNOSIS — E039 Hypothyroidism, unspecified: Secondary | ICD-10-CM | POA: Diagnosis not present

## 2021-07-14 DIAGNOSIS — I700 Atherosclerosis of aorta: Secondary | ICD-10-CM

## 2021-07-14 DIAGNOSIS — I351 Nonrheumatic aortic (valve) insufficiency: Secondary | ICD-10-CM

## 2021-07-14 DIAGNOSIS — I2510 Atherosclerotic heart disease of native coronary artery without angina pectoris: Secondary | ICD-10-CM

## 2021-07-14 DIAGNOSIS — N138 Other obstructive and reflux uropathy: Secondary | ICD-10-CM

## 2021-07-14 DIAGNOSIS — E785 Hyperlipidemia, unspecified: Secondary | ICD-10-CM | POA: Diagnosis not present

## 2021-07-14 DIAGNOSIS — E538 Deficiency of other specified B group vitamins: Secondary | ICD-10-CM | POA: Diagnosis not present

## 2021-07-14 DIAGNOSIS — R911 Solitary pulmonary nodule: Secondary | ICD-10-CM

## 2021-07-14 DIAGNOSIS — K219 Gastro-esophageal reflux disease without esophagitis: Secondary | ICD-10-CM

## 2021-07-14 DIAGNOSIS — D696 Thrombocytopenia, unspecified: Secondary | ICD-10-CM

## 2021-07-14 DIAGNOSIS — Z7689 Persons encountering health services in other specified circumstances: Secondary | ICD-10-CM

## 2021-07-14 DIAGNOSIS — F411 Generalized anxiety disorder: Secondary | ICD-10-CM

## 2021-07-14 DIAGNOSIS — N401 Enlarged prostate with lower urinary tract symptoms: Secondary | ICD-10-CM

## 2021-07-14 DIAGNOSIS — I10 Essential (primary) hypertension: Secondary | ICD-10-CM | POA: Diagnosis not present

## 2021-07-14 LAB — CBC WITH DIFFERENTIAL/PLATELET
Basophils Absolute: 0 10*3/uL (ref 0.0–0.1)
Basophils Relative: 0.2 % (ref 0.0–3.0)
Eosinophils Absolute: 0 10*3/uL (ref 0.0–0.7)
Eosinophils Relative: 0.3 % (ref 0.0–5.0)
HCT: 35.3 % — ABNORMAL LOW (ref 39.0–52.0)
Hemoglobin: 12 g/dL — ABNORMAL LOW (ref 13.0–17.0)
Lymphocytes Relative: 29.2 % (ref 12.0–46.0)
Lymphs Abs: 1.7 10*3/uL (ref 0.7–4.0)
MCHC: 34 g/dL (ref 30.0–36.0)
MCV: 92.6 fl (ref 78.0–100.0)
Monocytes Absolute: 0.5 10*3/uL (ref 0.1–1.0)
Monocytes Relative: 7.9 % (ref 3.0–12.0)
Neutro Abs: 3.7 10*3/uL (ref 1.4–7.7)
Neutrophils Relative %: 62.4 % (ref 43.0–77.0)
Platelets: 103 10*3/uL — ABNORMAL LOW (ref 150.0–400.0)
RBC: 3.81 Mil/uL — ABNORMAL LOW (ref 4.22–5.81)
RDW: 16.2 % — ABNORMAL HIGH (ref 11.5–15.5)
WBC: 6 10*3/uL (ref 4.0–10.5)

## 2021-07-14 LAB — LIPID PANEL
Cholesterol: 114 mg/dL (ref 0–200)
HDL: 63.7 mg/dL (ref >39.00)
LDL Cholesterol: 37 mg/dL (ref 0–99)
NonHDL: 50.35
Total CHOL/HDL Ratio: 2
Triglycerides: 69 mg/dL (ref 0.0–149.0)
VLDL: 13.8 mg/dL (ref 0.0–40.0)

## 2021-07-14 LAB — COMPREHENSIVE METABOLIC PANEL
ALT: 22 U/L (ref 0–53)
AST: 23 U/L (ref 0–37)
Albumin: 4.3 g/dL (ref 3.5–5.2)
Alkaline Phosphatase: 57 U/L (ref 39–117)
BUN: 28 mg/dL — ABNORMAL HIGH (ref 6–23)
CO2: 28 mEq/L (ref 19–32)
Calcium: 9.7 mg/dL (ref 8.4–10.5)
Chloride: 102 mEq/L (ref 96–112)
Creatinine, Ser: 1.06 mg/dL (ref 0.40–1.50)
GFR: 65.46 mL/min (ref >60.00)
Glucose, Bld: 90 mg/dL (ref 70–99)
Potassium: 4.5 mEq/L (ref 3.5–5.1)
Sodium: 138 mEq/L (ref 135–145)
Total Bilirubin: 0.7 mg/dL (ref 0.2–1.2)
Total Protein: 6.9 g/dL (ref 6.0–8.3)

## 2021-07-14 LAB — TSH: TSH: 2.65 u[IU]/mL (ref 0.35–5.50)

## 2021-07-14 LAB — VITAMIN B12: Vitamin B-12: 622 pg/mL (ref 211–911)

## 2021-07-14 MED ORDER — LEVOTHYROXINE SODIUM 75 MCG PO TABS: 75.0000 ug | ORAL_TABLET | Freq: Every day | ORAL | 3 refills | Status: DC

## 2021-07-14 MED ORDER — TAMSULOSIN HCL 0.4 MG PO CAPS: 0.4000 mg | ORAL_CAPSULE | Freq: Every day | ORAL | 1 refills | Status: DC

## 2021-07-14 MED ORDER — OMEPRAZOLE 40 MG PO CPDR
40.0000 mg | DELAYED_RELEASE_CAPSULE | Freq: Every day | ORAL | 3 refills | Status: DC
Start: 2021-07-14 — End: 2022-07-09

## 2021-07-14 MED ORDER — HYDROCHLOROTHIAZIDE 25 MG PO TABS: 25.0000 mg | ORAL_TABLET | Freq: Every day | ORAL | 1 refills | Status: DC

## 2021-07-14 MED ORDER — CHLORDIAZEPOXIDE HCL 10 MG PO CAPS: 10.0000 mg | ORAL_CAPSULE | Freq: Every day | ORAL | 3 refills | Status: AC

## 2021-07-14 MED ORDER — TAMSULOSIN HCL 0.4 MG PO CAPS: 0.4000 mg | ORAL_CAPSULE | Freq: Every day | ORAL | 3 refills | Status: DC

## 2021-07-14 MED ORDER — ROSUVASTATIN CALCIUM 10 MG PO TABS: 10.0000 mg | ORAL_TABLET | Freq: Every day | ORAL | 3 refills | Status: DC

## 2021-07-14 MED ORDER — ROSUVASTATIN CALCIUM 10 MG PO TABS: 10.0000 mg | ORAL_TABLET | Freq: Every day | ORAL | 1 refills | Status: DC

## 2021-07-14 MED ORDER — FINASTERIDE 5 MG PO TABS: 5.0000 mg | ORAL_TABLET | Freq: Every day | ORAL | 3 refills | Status: DC

## 2021-07-14 MED ORDER — LEVOTHYROXINE SODIUM 75 MCG PO TABS
75.0000 ug | ORAL_TABLET | Freq: Every day | ORAL | 1 refills | Status: DC
Start: 2021-07-14 — End: 2021-07-14

## 2021-07-14 MED ORDER — OMEPRAZOLE 40 MG PO CPDR: 40.0000 mg | DELAYED_RELEASE_CAPSULE | Freq: Every day | ORAL | 1 refills | Status: DC

## 2021-07-14 MED ORDER — HYDROCHLOROTHIAZIDE 25 MG PO TABS: 25.0000 mg | ORAL_TABLET | Freq: Every day | ORAL | 3 refills | Status: DC

## 2021-07-14 MED ORDER — CHLORDIAZEPOXIDE HCL 10 MG PO CAPS: 10.0000 mg | ORAL_CAPSULE | Freq: Three times a day (TID) | ORAL | 1 refills | Status: DC | PRN

## 2021-07-14 MED ORDER — FINASTERIDE 5 MG PO TABS: 5.0000 mg | ORAL_TABLET | Freq: Every day | ORAL | 1 refills | Status: DC

## 2021-07-14 NOTE — Assessment & Plan Note (Signed)
LDL goal less than 70. Continue rosuvastatin 10 mg daily

## 2021-07-14 NOTE — Assessment & Plan Note (Signed)
Moderate aortic regurgitation.  Stable.  Plan is to recheck echo every 2 to 3 years as per cardiologist.

## 2021-07-14 NOTE — Assessment & Plan Note (Signed)
Well-controlled on Flomax 0.4 mg and finasteride 5 mg daily.

## 2021-07-14 NOTE — Assessment & Plan Note (Signed)
Last platelet number over 100,000.  No clinical bleeding episodes.

## 2021-07-14 NOTE — Patient Instructions (Signed)
Health Maintenance After Age 82 After age 82, you are at a higher risk for certain long-term diseases and infections as well as injuries from falls. Falls are a major cause of broken bones and head injuries in people who are older than age 82. Getting regular preventive care can help to keep you healthy and well. Preventive care includes getting regular testing and making lifestyle changes as recommended by your health care provider. Talk with your health care provider about: Which screenings and tests you should have. A screening is a test that checks for a disease when you have no symptoms. A diet and exercise plan that is right for you. What should I know about screenings and tests to prevent falls? Screening and testing are the best ways to find a health problem early. Early diagnosis and treatment give you the best chance of managing medical conditions that are common after age 82. Certain conditions and lifestyle choices may make you more likely to have a fall. Your health care provider may recommend: Regular vision checks. Poor vision and conditions such as cataracts can make you more likely to have a fall. If you wear glasses, make sure to get your prescription updated if your vision changes. Medicine review. Work with your health care provider to regularly review all of the medicines you are taking, including over-the-counter medicines. Ask your health care provider about any side effects that may make you more likely to have a fall. Tell your health care provider if any medicines that you take make you feel dizzy or sleepy. Strength and balance checks. Your health care provider may recommend certain tests to check your strength and balance while standing, walking, or changing positions. Foot health exam. Foot pain and numbness, as well as not wearing proper footwear, can make you more likely to have a fall. Screenings, including: Osteoporosis screening. Osteoporosis is a condition that causes  the bones to get weaker and break more easily. Blood pressure screening. Blood pressure changes and medicines to control blood pressure can make you feel dizzy. Depression screening. You may be more likely to have a fall if you have a fear of falling, feel depressed, or feel unable to do activities that you used to do. Alcohol use screening. Using too much alcohol can affect your balance and may make you more likely to have a fall. Follow these instructions at home: Lifestyle Do not drink alcohol if: Your health care provider tells you not to drink. If you drink alcohol: Limit how much you have to: 0-1 drink a day for women. 0-2 drinks a day for men. Know how much alcohol is in your drink. In the [place] one drink equals one 12 oz bottle of beer (355 mL), one 5 oz glass of wine (148 mL), or one 1 oz glass of hard liquor (44 mL). Do not use any products that contain nicotine or tobacco. These products include cigarettes, chewing tobacco, and vaping devices, such as e-cigarettes. If you need help quitting, ask your health care provider. Activity  Follow a regular exercise program to stay fit. This will help you maintain your balance. Ask your health care provider what types of exercise are appropriate for you. If you need a cane or walker, use it as recommended by your health care provider. Wear supportive shoes that have nonskid soles. Safety  Remove any tripping hazards, such as rugs, cords, and clutter. Install safety equipment such as grab bars in bathrooms and safety rails on stairs. Keep rooms and walkways   well-lit. General instructions Talk with your health care provider about your risks for falling. Tell your health care provider if: You fall. Be sure to tell your health care provider about all falls, even ones that seem minor. You feel dizzy, tiredness (fatigue), or off-balance. Take over-the-counter and prescription medicines only as told by your health care provider. These include  supplements. Eat a healthy diet and maintain a healthy weight. A healthy diet includes low-fat dairy products, low-fat (lean) meats, and fiber from whole grains, beans, and lots of fruits and vegetables. Stay current with your vaccines. Schedule regular health, dental, and eye exams. Summary Having a healthy lifestyle and getting preventive care can help to protect your health and wellness after age 82. Screening and testing are the best way to find a health problem early and help you avoid having a fall. Early diagnosis and treatment give you the best chance for managing medical conditions that are more common for people who are older than age 82. Falls are a major cause of broken bones and head injuries in people who are older than age 82. Take precautions to prevent a fall at home. Work with your health care provider to learn what changes you can make to improve your health and wellness and to prevent falls. This information is not intended to replace advice given to you by your health care provider. Make sure you discuss any questions you have with your health care provider. Document Revised: 06/16/2020 Document Reviewed: 06/16/2020 Elsevier Patient Education  2023 Elsevier Inc.  

## 2021-07-14 NOTE — Assessment & Plan Note (Signed)
Stable.  Continue  omeprazole 40mg daily

## 2021-07-14 NOTE — Assessment & Plan Note (Signed)
Chronic anxiety.  Has been on Librium 10 mg daily for over 50 years according to patient.  Unable to stop at this time.  Patient aware this is not standard of care for management of chronic anxiety.

## 2021-07-14 NOTE — Assessment & Plan Note (Signed)
Stable.  Diet and nutrition discussed.  Continue rosuvastatin 10 mg daily.  

## 2021-07-14 NOTE — Assessment & Plan Note (Signed)
Clinically euthyroid.  TSH done today. Continue Synthroid 75 mcg daily. 

## 2021-07-14 NOTE — Progress Notes (Signed)
Collin Barnes 81 y.o.   Chief Complaint  Patient presents with   New Patient (Initial Visit)    Multiple questions    HISTORY OF PRESENT ILLNESS: This is a 81 y.o. male first visit to this office, here to establish care with me. Recently moved to this area from [place]. Non-smoker.  Physically active. Multiple chronic medical problems, stable and under control. On multiple medications.  Medication list reviewed with patient. List includes Librium which he takes 10 mg in the morning and has been taking for the last 50 years. History of hypothyroidism History of BPH History of chronic anxiety Recent cardiologist office visit assessment and plan as follows: ASSESSMENT:     1. Nonrheumatic aortic valve insufficiency   2. Essential hypertension   3. Coronary artery calcification seen on CT scan   4. Aortic atherosclerosis (HCC)   5. Pure hypercholesterolemia   6. Sinus bradycardia     PLAN:     Hypotension, sinus bradycardia: -asymptomatic -reports he does not routinely check at home. Last visit in Care everywhere also with low BP -we will trial cutting atenolol to 25 mg daily. He will check BP and heart rate and contact us with results in several weeks   History of hypertension: -on atenolol (decreasing as above) and HCTZ. Reports good PO intake, had previously had HCTZ stopped but BP rose   Coronary calcification, consistent with nonobstructive CAD Aortic atherosclerosis Hypercholesterolemia, LDL goal <70 -tolerating rosuvastatin -no aspirin given chronic anemia/thrombocytopenia and history of GI bleed   Moderate aortic regurgitation -recheck echo every 2-3 years, will recheck 07/2022 (in [place] until about May every year) -reviewed symptoms that need urgent attention   Cardiac risk counseling and prevention recommendations: -recommend heart healthy/Mediterranean diet, with whole grains, fruits, vegetable, fish, lean meats, nuts, and olive oil. Limit salt. -recommend  moderate walking, 3-5 times/week for 30-50 minutes each session. Aim for at least 150 minutes.week. Goal should be pace of 3 miles/hours, or walking 1.5 miles in 30 minutes -recommend avoidance of tobacco products. Avoid excess alcohol.   Plan for follow up: Following repeat echo June 2024, or sooner as needed.   Jodelle Red, MD, PhD, [facility]  [facility]    HPI   Prior to Admission medications   Medication Sig Start Date End Date Taking? Authorizing Provider  Apoaequorin (PREVAGEN PO) Take by mouth.   Yes [provider]  atenolol (TENORMIN) 25 MG tablet Take 1 tablet (25 mg total) by mouth daily. 05/04/21 04/29/22 Yes Jodelle Red, MD  Biotin 1000 MCG CHEW Chew by mouth.   Yes [provider]  Calcium Carb-Cholecalciferol (CALCIUM/VITAMIN D PO) Take by mouth.   Yes [provider]  chlordiazePOXIDE (LIBRIUM) 10 MG capsule Take 10 mg by mouth 3 (three) times daily as needed for anxiety.   Yes [provider]  cyanocobalamin (,VITAMIN B-12,) 1000 MCG/ML injection Inject 1,000 mcg into the muscle every 30 (thirty) days. 05/08/21  Yes [provider]  diphenhydrAMINE (SOMINEX) 25 MG tablet Take 25 mg by mouth at bedtime as needed for sleep.   Yes [provider]  FERROUS SULFATE PO Take by mouth.   Yes [provider]  finasteride (PROSCAR) 5 MG tablet Take 5 mg by mouth daily.   Yes [provider]  hydrochlorothiazide (HYDRODIURIL) 25 MG tablet Take 25 mg by mouth daily.   Yes [provider]  Lactobacillus-Inulin (CULTURELLE DIGESTIVE DAILY PO) Take by mouth.   Yes [provider]  levothyroxine (SYNTHROID) 75  MCG tablet Take 75 mcg by mouth daily before breakfast.   Yes [provider]  Lutein 40 MG CAPS Take by mouth.   Yes [provider]  Multiple Vitamin (MULTIVITAMIN ADULT PO) Take by mouth.   Yes [provider]  Omega-3 Fatty Acids  (FISH OIL) 1000 MG CAPS Take by mouth.   Yes [provider]  omeprazole (PRILOSEC) 40 MG capsule Take 40 mg by mouth daily.   Yes [provider]  rosuvastatin (CRESTOR) 10 MG tablet Take 10 mg by mouth daily.   Yes [provider]  tamsulosin (FLOMAX) 0.4 MG CAPS capsule Take 0.4 mg by mouth.   Yes [provider]  vitamin C (ASCORBIC ACID) 500 MG tablet Take 500 mg by mouth daily.   Yes [provider]  clotrimazole (LOTRIMIN) 1 % cream Apply topically 2 (two) times daily. Patient not taking: Reported on 07/14/2021 05/11/21   [provider]  gabapentin (NEURONTIN) 100 MG capsule Take 1 capsule (100 mg total) by mouth at bedtime. Patient not taking: Reported on 07/14/2021 02/23/21   Judi Saa, DO  mupirocin ointment (BACTROBAN) 2 % For decolonization, apply with cotton tip applicator to each nare once daily for 7 days.  For abscess treatment, apply to affected area twice daily for 10 days. Patient not taking: Reported on 07/14/2021 05/24/21   Theadora Rama Scales, PA-C  nystatin (MYCOSTATIN/NYSTOP) powder Apply 1 application. topically 3 (three) times daily. Patient not taking: Reported on 07/14/2021 05/18/21   Ward, Shanda Bumps Z, PA-C  potassium chloride SA (KLOR-CON M) 20 MEQ tablet Take 1 tablet (20 mEq total) by mouth 2 (two) times daily for 3 days. 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   Nonrheumatic aortic valve insufficiency 05/04/2021   Coronary artery calcification seen on CT scan 05/04/2021   Aortic atherosclerosis (HCC) 05/04/2021   Pure hypercholesterolemia 05/04/2021   Spinal stenosis, lumbar region, with  neurogenic claudication 02/23/2021   Essential hypertension 07/29/2020   Hyperlipidemia, unspecified 07/29/2020   Lung nodule 11/07/2019   GERD (gastroesophageal reflux disease) 09/15/2019   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 No pertinent past medical history.  History reviewed. No pertinent surgical history.  Social History   Socioeconomic History   Marital status: Married    Spouse name: Not on file   Number of children: Not on file   Years of education: Not on file   Highest education level: Not on file  Occupational History   Not on file  Tobacco Use   Smoking status: Never   Smokeless tobacco: Never  Substance and Sexual Activity   Alcohol use: Never   Drug use: Never   Sexual activity: Not on file  Other Topics Concern   Not on file  Social History Narrative   Not on file   Social Determinants of Health   Financial Resource Strain: Low Risk    Difficulty of Paying Living Expenses: Not hard at all  Food Insecurity: No Food Insecurity  Worried About Programme researcher, broadcasting/film/video in the Last Year: Never true   [employer] of Food in the Last Year: Never true  Transportation Needs: No Transportation Needs   Lack of Transportation (Medical): No   Lack of Transportation (Non-Medical): No  Physical Activity: Insufficiently Active   Days of Exercise per Week: 4 days   Minutes of Exercise per Session: 30 min  Stress: Not on file  Social Connections: Not on file  Intimate Partner Violence: Not on file    History reviewed. No pertinent family history.   Review of Systems  Constitutional: Negative.  Negative for chills and fever.  HENT: Negative.  Negative for congestion and sore throat.   Respiratory: Negative.  Negative for cough and shortness of breath.   Cardiovascular: Negative.   Negative for chest pain and palpitations.  Gastrointestinal:  Negative for abdominal pain, diarrhea, nausea and vomiting.  Genitourinary: Negative.   Skin: Negative.  Negative for rash.  Neurological: Negative.  Negative for dizziness and headaches.  All other systems reviewed and are negative.  Vitals:   07/14/21 1039  BP: 110/68  Pulse: 75  Temp: 97.8 F (36.6 C)  SpO2: 98%    Physical Exam Vitals reviewed.  Constitutional:      Appearance: Normal appearance.  HENT:     Head: Normocephalic.     Right Ear: Tympanic membrane, ear canal and external ear normal.     Left Ear: Tympanic membrane, ear canal and external ear normal.     Mouth/Throat:     Mouth: Mucous membranes are moist.     Pharynx: Oropharynx is clear.  Eyes:     Extraocular Movements: Extraocular movements intact.     Pupils: Pupils are equal, round, and reactive to light.  Cardiovascular:     Rate and Rhythm: Normal rate and regular rhythm.     Pulses: Normal pulses.     Heart sounds: Normal heart sounds.  Pulmonary:     Effort: Pulmonary effort is normal.     Breath sounds: Normal breath sounds.  Musculoskeletal:     Cervical back: No tenderness.  Lymphadenopathy:     Cervical: No cervical adenopathy.  Skin:    General: Skin is warm and dry.     Capillary Refill: Capillary refill takes less than 2 seconds.  Neurological:     General: No focal deficit present.     Mental Status: He is alert and oriented to person, place, and time.  Psychiatric:        Mood and Affect: Mood normal.        Behavior: Behavior normal.     ASSESSMENT & PLAN: A total of 62 minutes was spent with the patient and counseling/coordination of care regarding preparing for this visit, review of available medical records, review of most recent cardiologist office visit notes, review of multiple chronic medical conditions and their management, review of all medications, comprehensive history and physical examination, review of  most recent blood work results, education on nutrition, prognosis, documentation, need for follow-up.  Problem List Items Addressed This Visit       Cardiovascular and Mediastinum   Nonrheumatic aortic valve insufficiency    Moderate aortic regurgitation.  Stable.  Plan is to recheck echo every 2 to 3 years as per cardiologist.       Relevant Medications   rosuvastatin (CRESTOR) 10 MG tablet   hydrochlorothiazide (HYDRODIURIL) 25 MG tablet   Other Relevant Orders   CBC with Differential/Platelet   Coronary artery calcification  seen on CT scan    Nonobstructive coronary artery disease No aspirin given chronic anemia and thrombocytopenia and history of GI bleed       Relevant Medications   rosuvastatin (CRESTOR) 10 MG tablet   hydrochlorothiazide (HYDRODIURIL) 25 MG tablet   Aortic atherosclerosis (HCC)    LDL goal less than 70. Continue rosuvastatin 10 mg daily       Relevant Medications   rosuvastatin (CRESTOR) 10 MG tablet   hydrochlorothiazide (HYDRODIURIL) 25 MG tablet   Other Relevant Orders   CBC with Differential/Platelet   Essential hypertension - Primary    Well-controlled hypertension.  Atenolol recently decreased to 25 mg daily.  Normal blood pressure readings at home.  Continue hydrochlorothiazide 25 mg daily       Relevant Medications   rosuvastatin (CRESTOR) 10 MG tablet   hydrochlorothiazide (HYDRODIURIL) 25 MG tablet   Other Relevant Orders   Comprehensive metabolic panel   CBC with Differential/Platelet   Moderate aortic regurgitation   Relevant Medications   rosuvastatin (CRESTOR) 10 MG tablet   hydrochlorothiazide (HYDRODIURIL) 25 MG tablet     Respiratory   Lung nodule     Digestive   GERD (gastroesophageal reflux disease)    Stable.  Continue omeprazole 40 mg daily.       Relevant Medications   omeprazole (PRILOSEC) 40 MG capsule     Endocrine   Hypothyroidism    Clinically euthyroid.  TSH done today. Continue Synthroid 75 mcg  daily.       Relevant Medications   levothyroxine (SYNTHROID) 75 MCG tablet   Other Relevant Orders   TSH     Genitourinary   BPH with urinary obstruction    Well-controlled on Flomax 0.4 mg and finasteride 5 mg daily.       Relevant Medications   tamsulosin (FLOMAX) 0.4 MG CAPS capsule   finasteride (PROSCAR) 5 MG tablet     Hematopoietic and Hemostatic   Thrombocytopenia (HCC)    Last platelet number over 100,000.  No clinical bleeding episodes.         Other   Anxiety state    Chronic anxiety.  Has been on Librium 10 mg daily for over 50 years according to patient.  Unable to stop at this time.  Patient aware this is not standard of care for management of chronic anxiety.       Relevant Medications   chlordiazePOXIDE (LIBRIUM) 10 MG capsule   Dyslipidemia    Stable.  Diet and nutrition discussed.  Continue rosuvastatin 10 mg daily.       Relevant Medications   rosuvastatin (CRESTOR) 10 MG tablet   Other Relevant Orders   Lipid panel   B12 deficiency   Relevant Orders   Vitamin B12   Other Visit Diagnoses     Encounter to establish care          Patient Instructions  Health Maintenance After Age 64 After age 36, you are at a higher risk for certain long-term diseases and infections as well as injuries from falls. Falls are a major cause of broken bones and head injuries in people who are older than age 87. Getting regular preventive care can help to keep you healthy and well. Preventive care includes getting regular testing and making lifestyle changes as recommended by your health care provider. Talk with your health care provider about: Which screenings and tests you should have. A screening is a test that checks for a disease when you have no symptoms.  A diet and exercise plan that is right for you. What should I know about screenings and tests to prevent falls? Screening and testing are the best ways to find a health problem early. Early diagnosis and  treatment give you the best chance of managing medical conditions that are common after age 82. Certain conditions and lifestyle choices may make you more likely to have a fall. Your health care provider may recommend: Regular vision checks. Poor vision and conditions such as cataracts can make you more likely to have a fall. If you wear glasses, make sure to get your prescription updated if your vision changes. Medicine review. Work with your health care provider to regularly review all of the medicines you are taking, including over-the-counter medicines. Ask your health care provider about any side effects that may make you more likely to have a fall. Tell your health care provider if any medicines that you take make you feel dizzy or sleepy. Strength and balance checks. Your health care provider may recommend certain tests to check your strength and balance while standing, walking, or changing positions. Foot health exam. Foot pain and numbness, as well as not wearing proper footwear, can make you more likely to have a fall. Screenings, including: Osteoporosis screening. Osteoporosis is a condition that causes the bones to get weaker and break more easily. Blood pressure screening. Blood pressure changes and medicines to control blood pressure can make you feel dizzy. Depression screening. You may be more likely to have a fall if you have a fear of falling, feel depressed, or feel unable to do activities that you used to do. Alcohol use screening. Using too much alcohol can affect your balance and may make you more likely to have a fall. Follow these instructions at home: Lifestyle Do not drink alcohol if: Your health care provider tells you not to drink. If you drink alcohol: Limit how much you have to: 0-1 drink a day for women. 0-2 drinks a day for men. Know how much alcohol is in your drink. In the [place] one drink equals one 12 oz bottle of beer (355 mL), one 5 oz glass of wine (148 mL), or  one 1 oz glass of hard liquor (44 mL). Do not use any products that contain nicotine or tobacco. These products include cigarettes, chewing tobacco, and vaping devices, such as e-cigarettes. If you need help quitting, ask your health care provider. Activity  Follow a regular exercise program to stay fit. This will help you maintain your balance. Ask your health care provider what types of exercise are appropriate for you. If you need a cane or walker, use it as recommended by your health care provider. Wear supportive shoes that have nonskid soles. Safety  Remove any tripping hazards, such as rugs, cords, and clutter. Install safety equipment such as grab bars in bathrooms and safety rails on stairs. Keep rooms and walkways well-lit. General instructions Talk with your health care provider about your risks for falling. Tell your health care provider if: You fall. Be sure to tell your health care provider about all falls, even ones that seem minor. You feel dizzy, tiredness (fatigue), or off-balance. Take over-the-counter and prescription medicines only as told by your health care provider. These include supplements. Eat a healthy diet and maintain a healthy weight. A healthy diet includes low-fat dairy products, low-fat (lean) meats, and fiber from whole grains, beans, and lots of fruits and vegetables. Stay current with your vaccines. Schedule regular health, dental, and  eye exams. Summary Having a healthy lifestyle and getting preventive care can help to protect your health and wellness after age 7. Screening and testing are the best way to find a health problem early and help you avoid having a fall. Early diagnosis and treatment give you the best chance for managing medical conditions that are more common for people who are older than age 31. Falls are a major cause of broken bones and head injuries in people who are older than age 68. Take precautions to prevent a fall at home. Work  with your health care provider to learn what changes you can make to improve your health and wellness and to prevent falls. This information is not intended to replace advice given to you by your health care provider. Make sure you discuss any questions you have with your health care provider. Document Revised: 06/16/2020 Document Reviewed: 06/16/2020 Elsevier Patient Education  2023 Elsevier Inc.     Edwina Barth, MD [facility]

## 2021-07-14 NOTE — Assessment & Plan Note (Signed)
Well-controlled hypertension.  Atenolol recently decreased to 25 mg daily.  Normal blood pressure readings at home.  Continue hydrochlorothiazide 25 mg daily

## 2021-07-14 NOTE — Assessment & Plan Note (Signed)
Nonobstructive coronary artery disease No aspirin given chronic anemia and thrombocytopenia and history of GI bleed

## 2021-10-01 NOTE — Progress Notes (Signed)
[facility] [address] [place] [address] Phone: [phone] Subjective:   Collin Barnes, am serving as a scribe for Dr. Antoine Primas.  I'm seeing this patient by the request  of:  Sagardia, Miguel Jose, MD  CC: Low back pain follow-up  IRJ:[id number]  02/23/2021 Patient is having lumbar spinal stenosis.  Has been recently moved here.  Did check patient in the database and has been using tramadol appropriately.  Discussed with patient with his age and fall risk do not want to make this a long-term medication.  Discussed with him that physical therapy would be more beneficial.  I also think that possible epidural would help well with the pain as well.  Epidural ordered today, given a 5-day dose of the tramadol and encouraged him to try half a pill instead of a full pill if necessary.  Patient [place] another state [place] the next 2 months and we will see how patient does with physical therapy down there.  Patient knows if any worsening symptoms to seek medical attention.  Patient is in agreement with the plan, work with athletic trainer today to learn some home exercises in the interim.  Follow-up with me again in 3 months when he returns to [facility].   Patient does have the MRI report and images on his phone that we did evaluate today that is consistent with moderate to severe spinal stenosis at the L5-S1 area.  Update 10/05/2021 Collin Barnes is a 81 y.o. male coming in with complaint of lumbar spinal stenosis. Epidural on 02/25/2021. Patient states that epidural and PT have been helpful. Woke up yesterday with pain in lumbar spine.      No past medical history on file. No past surgical history on file. Social History   Socioeconomic History   Marital status: Married    Spouse name: Not on file   Number of children: Not on file   Years of education: Not on file   Highest education level: Not on file  Occupational History   Not on  file  Tobacco Use   Smoking status: Never   Smokeless tobacco: Never  Substance and Sexual Activity   Alcohol use: Never   Drug use: Never   Sexual activity: Not on file  Other Topics Concern   Not on file  Social History Narrative   Not on file   Social Determinants of Health   Financial Resource Strain: Low Risk  (05/04/2021)   Overall Financial Resource Strain (CARDIA)    Difficulty of Paying Living Expenses: Not hard at all  Food Insecurity: No Food Insecurity (05/04/2021)   Hunger Vital Sign    Worried About Running Out of Food in the Last Year: Never true    Ran Out of Food in the Last Year: Never true  Transportation Needs: No Transportation Needs (05/04/2021)   PRAPARE - Administrator, Civil Service (Medical): No    Lack of Transportation (Non-Medical): No  Physical Activity: Insufficiently Active (05/04/2021)   Exercise Vital Sign    Days of Exercise per Week: 4 days    Minutes of Exercise per Session: 30 min  Stress: Not on file  Social Connections: Not on file   No Known Allergies No family history on file.  Current Outpatient Medications (Endocrine & Metabolic):    levothyroxine (SYNTHROID) 75 MCG tablet, Take 1 tablet (75 mcg total) by mouth daily before breakfast.  Current Outpatient Medications (Cardiovascular):  atenolol (TENORMIN) 25 MG tablet, Take 1 tablet (25 mg total) by mouth daily.   hydrochlorothiazide (HYDRODIURIL) 25 MG tablet, Take 1 tablet (25 mg total) by mouth daily.   rosuvastatin (CRESTOR) 10 MG tablet, Take 1 tablet (10 mg total) by mouth daily.    Current Outpatient Medications (Hematological):    cyanocobalamin (,VITAMIN B-12,) 1000 MCG/ML injection, Inject 1,000 mcg into the muscle every 30 (thirty) days.   FERROUS SULFATE PO, Take by mouth.  Current Outpatient Medications (Other):    Apoaequorin (PREVAGEN PO), Take by mouth.   Biotin 1000 MCG CHEW, Chew by mouth.   Calcium Carb-Cholecalciferol (CALCIUM/VITAMIN D PO),  Take by mouth.   chlordiazePOXIDE (LIBRIUM) 10 MG capsule, Take 1 capsule (10 mg total) by mouth daily with breakfast.   diphenhydrAMINE (SOMINEX) 25 MG tablet, Take 25 mg by mouth at bedtime as needed for sleep.   finasteride (PROSCAR) 5 MG tablet, Take 1 tablet (5 mg total) by mouth daily.   gabapentin (NEURONTIN) 100 MG capsule, Take 1 capsule (100 mg total) by mouth at bedtime.   Lactobacillus-Inulin (CULTURELLE DIGESTIVE DAILY PO), Take by mouth.   Lutein 40 MG CAPS, Take by mouth.   Multiple Vitamin (MULTIVITAMIN ADULT PO), Take by mouth.   Omega-3 Fatty Acids (FISH OIL) 1000 MG CAPS, Take by mouth.   omeprazole (PRILOSEC) 40 MG capsule, Take 1 capsule (40 mg total) by mouth daily.   tamsulosin (FLOMAX) 0.4 MG CAPS capsule, Take 1 capsule (0.4 mg total) by mouth daily.   vitamin C (ASCORBIC ACID) 500 MG tablet, Take 500 mg by mouth daily.   clotrimazole (LOTRIMIN) 1 % cream, Apply topically 2 (two) times daily. (Patient not taking: Reported on 07/14/2021)   mupirocin ointment (BACTROBAN) 2 %, For decolonization, apply with cotton tip applicator to each nare once daily for 7 days.  For abscess treatment, apply to affected area twice daily for 10 days. (Patient not taking: Reported on 07/14/2021)   nystatin (MYCOSTATIN/NYSTOP) powder, Apply 1 application. topically 3 (three) times daily. (Patient not taking: Reported on 07/14/2021)   potassium chloride SA (KLOR-CON M) 20 MEQ tablet, Take 1 tablet (20 mEq total) by mouth 2 (two) times daily for 3 days.   Reviewed prior external information including notes and imaging from  primary care provider As well as notes that were available from care everywhere and other healthcare systems.  Past medical history, social, surgical and family history all reviewed in electronic medical record.  No pertanent information unless stated regarding to the chief complaint.   Review of Systems:  No headache, visual changes, nausea, vomiting, diarrhea, constipation,  dizziness, abdominal pain, skin rash, fevers, chills, night sweats, weight loss, swollen lymph nodes, body aches, joint swelling, chest pain, shortness of breath, mood changes. POSITIVE muscle aches  Objective  Blood pressure 124/66, pulse (!) 56, height 5\' 8"  (1.727 m), weight 205 lb (93 kg), SpO2 99 %.   General: No apparent distress alert and oriented x3 mood and affect normal, dressed appropriately.  HEENT: Pupils equal, extraocular movements intact  Respiratory: Patient's speak in full sentences and does not appear short of breath  Cardiovascular: No lower extremity edema, non tender, no erythema  Patient moves cautiously.  Able to go walk without any significant difficulty.  Patient is mildly tender to palpation in the lower back.  No loss of lordosis noted. Tightness with straight leg test but no true radicular symptoms.  Only 5 degrees of extension   Impression and Recommendations:     The  above documentation has been reviewed and is accurate and complete Collin Pulley, DO

## 2021-10-05 ENCOUNTER — Ambulatory Visit (INDEPENDENT_AMBULATORY_CARE_PROVIDER_SITE_OTHER): Payer: Medicare Other | Admitting: Family Medicine

## 2021-10-05 VITALS — BP 124/66 | HR 56 | Ht 68.0 in | Wt 205.0 lb

## 2021-10-05 DIAGNOSIS — M5416 Radiculopathy, lumbar region: Secondary | ICD-10-CM | POA: Diagnosis not present

## 2021-10-05 DIAGNOSIS — M48062 Spinal stenosis, lumbar region with neurogenic claudication: Secondary | ICD-10-CM | POA: Diagnosis not present

## 2021-10-05 DIAGNOSIS — M545 Low back pain, unspecified: Secondary | ICD-10-CM

## 2021-10-05 DIAGNOSIS — I2510 Atherosclerotic heart disease of native coronary artery without angina pectoris: Secondary | ICD-10-CM

## 2021-10-05 NOTE — Patient Instructions (Addendum)
[facility] [phone] Call Today  Vit D 2000iu daily Tart [place] 1200mg  nightly Voltaren gel Keep updated

## 2021-10-05 NOTE — Assessment & Plan Note (Signed)
Patient does have spinal stenosis and is having exacerbation of this chronic condition.  Patient did do well with the first epidural and I do think that we should consider repeat imaging at the moment.  Discussed which activities to do which ones to avoid.  Increase activity slowly otherwise.  We discussed different medications that could be helpful as well including the gabapentin but he feels like the injection did not work as well as the exercises.  Patient will increase activity slowly otherwise.  Follow-up with me again in 6 to 8 weeks

## 2021-10-09 ENCOUNTER — Ambulatory Visit
Admission: RE | Admit: 2021-10-09 | Discharge: 2021-10-09 | Disposition: A | Discharge status: Home / Self Care | Payer: Medicare Other | Source: Ambulatory Visit | Attending: Family Medicine | Admitting: Family Medicine

## 2021-10-09 ENCOUNTER — Other Ambulatory Visit: Payer: Self-pay | Admitting: Family Medicine

## 2021-10-09 DIAGNOSIS — M5416 Radiculopathy, lumbar region: Secondary | ICD-10-CM

## 2021-10-09 MED ORDER — IOPAMIDOL (ISOVUE-M 200) INJECTION 41%
1.0000 mL | Freq: Once | INTRAMUSCULAR | Status: AC
  Administered 2021-10-09: 1 mL via EPIDURAL

## 2021-10-09 MED ORDER — METHYLPREDNISOLONE ACETATE 40 MG/ML INJ SUSP (RADIOLOG
80.0000 mg | Freq: Once | INTRAMUSCULAR | Status: AC
  Administered 2021-10-09: 80 mg via EPIDURAL

## 2021-10-09 NOTE — Discharge Instructions (Signed)

## 2021-10-19 ENCOUNTER — Ambulatory Visit: Payer: Medicare Other | Admitting: Family Medicine

## 2021-11-25 ENCOUNTER — Telehealth: Payer: Self-pay

## 2021-11-25 MED ORDER — TAMSULOSIN HCL 0.4 MG PO CAPS: 0.4000 mg | ORAL_CAPSULE | Freq: Every day | ORAL | 3 refills | Status: DC

## 2021-11-25 NOTE — Telephone Encounter (Signed)
Medication sent to patient pharmacy on file

## 2021-11-25 NOTE — Telephone Encounter (Signed)
MEDICATION: tamsulosin (FLOMAX) 0.4 MG CAPS capsule  PHARMACY: [facility] : [place], [place] [address]  Comments:   **Let patient know to contact pharmacy at the end of the day to make sure medication is ready. **  ** Please notify patient to allow 48-72 hours to process**  **Encourage patient to contact the pharmacy for refills or they can request refills through [facility]**

## 2022-01-21 NOTE — Progress Notes (Signed)
[facility] [address] [place] [address] Phone: [phone] Subjective:   Collin Barnes, am serving as a scribe for Dr. Antoine Primas.  I'm seeing this patient by the request  of:  Sagardia, Miguel Jose, MD  CC: Low back pain  [id number]  10/05/2021 Patient does have spinal stenosis and is having exacerbation of this chronic condition.  Patient did do well with the first epidural and I do think that we should consider repeat imaging at the moment.  Discussed which activities to do which ones to avoid.  Increase activity slowly otherwise.   We discussed different medications that could be helpful as well including the gabapentin but he feels like the injection did not work as well as the exercises.  Patient will increase activity slowly otherwise.  Follow-up with me again in 6 to 8 weeks  Update 01/25/2022 Collin Barnes is a 81 y.o. male coming in with complaint of lumbar spinal stenosis. Epidural/Nerve Root 10/09/2021. Patient states that injection was helpful. Patient has pain in lumbar spine in the back but manages it with Tylenol daily. Going to be gone for 4 months and wants to get injection in January prior to leaving.        No past medical history on file. No past surgical history on file. Social History   Socioeconomic History   Marital status: Married    Spouse name: Not on file   Number of children: Not on file   Years of education: Not on file   Highest education level: Not on file  Occupational History   Not on file  Tobacco Use   Smoking status: Never   Smokeless tobacco: Never  Substance and Sexual Activity   Alcohol use: Never   Drug use: Never   Sexual activity: Not on file  Other Topics Concern   Not on file  Social History Narrative   Not on file   Social Determinants of Health   Financial Resource Strain: Low Risk  (05/04/2021)   Overall Financial Resource Strain (CARDIA)    Difficulty of Paying Living  Expenses: Not hard at all  Food Insecurity: No Food Insecurity (05/04/2021)   Hunger Vital Sign    Worried About Running Out of Food in the Last Year: Never true    Ran Out of Food in the Last Year: Never true  Transportation Needs: No Transportation Needs (05/04/2021)   PRAPARE - Administrator, Civil Service (Medical): No    Lack of Transportation (Non-Medical): No  Physical Activity: Insufficiently Active (05/04/2021)   Exercise Vital Sign    Days of Exercise per Week: 4 days    Minutes of Exercise per Session: 30 min  Stress: Not on file  Social Connections: Not on file   No Known Allergies No family history on file.  Current Outpatient Medications (Endocrine & Metabolic):    levothyroxine (SYNTHROID) 75 MCG tablet, Take 1 tablet (75 mcg total) by mouth daily before breakfast.  Current Outpatient Medications (Cardiovascular):    atenolol (TENORMIN) 25 MG tablet, Take 1 tablet (25 mg total) by mouth daily.   hydrochlorothiazide (HYDRODIURIL) 25 MG tablet, Take 1 tablet (25 mg total) by mouth daily.   rosuvastatin (CRESTOR) 10 MG tablet, Take 1 tablet (10 mg total) by mouth daily.    Current Outpatient Medications (Hematological):    cyanocobalamin (,VITAMIN B-12,) 1000 MCG/ML injection, Inject 1,000 mcg into the muscle every 30 (thirty) days.   FERROUS SULFATE PO, Take by  mouth.  Current Outpatient Medications (Other):    Apoaequorin (PREVAGEN PO), Take by mouth.   Biotin 1000 MCG CHEW, Chew by mouth.   Calcium Carb-Cholecalciferol (CALCIUM/VITAMIN D PO), Take by mouth.   chlordiazePOXIDE (LIBRIUM) 10 MG capsule, Take 1 capsule (10 mg total) by mouth daily with breakfast.   diphenhydrAMINE (SOMINEX) 25 MG tablet, Take 25 mg by mouth at bedtime as needed for sleep.   finasteride (PROSCAR) 5 MG tablet, Take 1 tablet (5 mg total) by mouth daily.   gabapentin (NEURONTIN) 100 MG capsule, Take 2 capsules (200 mg total) by mouth at bedtime.   Lactobacillus-Inulin  (CULTURELLE DIGESTIVE DAILY PO), Take by mouth.   Lutein 40 MG CAPS, Take by mouth.   Multiple Vitamin (MULTIVITAMIN ADULT PO), Take by mouth.   Omega-3 Fatty Acids (FISH OIL) 1000 MG CAPS, Take by mouth.   omeprazole (PRILOSEC) 40 MG capsule, Take 1 capsule (40 mg total) by mouth daily.   tamsulosin (FLOMAX) 0.4 MG CAPS capsule, Take 1 capsule (0.4 mg total) by mouth daily.   vitamin C (ASCORBIC ACID) 500 MG tablet, Take 500 mg by mouth daily.   clotrimazole (LOTRIMIN) 1 % cream, Apply topically 2 (two) times daily. (Patient not taking: Reported on 07/14/2021)   mupirocin ointment (BACTROBAN) 2 %, For decolonization, apply with cotton tip applicator to each nare once daily for 7 days.  For abscess treatment, apply to affected area twice daily for 10 days. (Patient not taking: Reported on 07/14/2021)   nystatin (MYCOSTATIN/NYSTOP) powder, Apply 1 application. topically 3 (three) times daily. (Patient not taking: Reported on 07/14/2021)   potassium chloride SA (KLOR-CON M) 20 MEQ tablet, Take 1 tablet (20 mEq total) by mouth 2 (two) times daily for 3 days.   Reviewed prior external information including notes and imaging from  primary care provider As well as notes that were available from care everywhere and other healthcare systems.  Past medical history, social, surgical and family history all reviewed in electronic medical record.  No pertanent information unless stated regarding to the chief complaint.   Review of Systems:  No headache, visual changes, nausea, vomiting, diarrhea, constipation, dizziness, abdominal pain, skin rash, fevers, chills, night sweats, weight loss, swollen lymph nodes, body aches, joint swelling, chest pain, shortness of breath, mood changes. POSITIVE muscle aches  Objective  Blood pressure (!) 142/62, pulse (!) 47, height 5\' 8"  (1.727 m), weight 207 lb (93.9 kg), SpO2 97 %.   General: No apparent distress alert and oriented x3 mood and affect normal, dressed  appropriately.  HEENT: Pupils equal, extraocular movements intact  Respiratory: Patient's speak in full sentences and does not appear short of breath  Cardiovascular: No lower extremity edema, non tender, no erythema  Low back exam does have some loss lordosis.  Some tenderness to palpation in the paraspinal musculature.  Patient is sitting relatively comfortably.  Patient does have a mild shuffling gait noted.    Impression and Recommendations:    The above documentation has been reviewed and is accurate and complete , DO

## 2022-01-26 ENCOUNTER — Encounter: Payer: Self-pay | Admitting: Family Medicine

## 2022-01-26 ENCOUNTER — Ambulatory Visit (INDEPENDENT_AMBULATORY_CARE_PROVIDER_SITE_OTHER): Payer: Medicare Other | Admitting: Family Medicine

## 2022-01-26 VITALS — BP 142/62 | HR 47 | Ht 68.0 in | Wt 207.0 lb

## 2022-01-26 DIAGNOSIS — I2510 Atherosclerotic heart disease of native coronary artery without angina pectoris: Secondary | ICD-10-CM

## 2022-01-26 DIAGNOSIS — M48062 Spinal stenosis, lumbar region with neurogenic claudication: Secondary | ICD-10-CM | POA: Diagnosis not present

## 2022-01-26 DIAGNOSIS — M5416 Radiculopathy, lumbar region: Secondary | ICD-10-CM | POA: Diagnosis not present

## 2022-01-26 MED ORDER — GABAPENTIN 100 MG PO CAPS: 200.0000 mg | ORAL_CAPSULE | Freq: Every day | ORAL | 0 refills | Status: DC

## 2022-01-26 NOTE — Patient Instructions (Signed)
Epidural injection [phone] Gabapentin 200mg  at night Write if you need injection when coming back

## 2022-01-26 NOTE — Assessment & Plan Note (Signed)
Patient does have a spinal stenosis but has responded extremely well to the nerve root injections.  Will be traveling and living in another state for 3 months and would like another one now that patient is having some increasing radicular symptoms again.  Patient will have the epidural ordered.  Increase gabapentin to 200 mg for the mild exacerbation.  Patient will follow-up with me again when he has back into the state.  Encouraged to the home exercises as well.

## 2022-02-04 ENCOUNTER — Ambulatory Visit
Admission: RE | Admit: 2022-02-04 | Discharge: 2022-02-04 | Disposition: A | Discharge status: Home / Self Care | Payer: Medicare Other | Source: Ambulatory Visit | Attending: Family Medicine | Admitting: Family Medicine

## 2022-02-04 DIAGNOSIS — M5416 Radiculopathy, lumbar region: Secondary | ICD-10-CM

## 2022-02-04 MED ORDER — METHYLPREDNISOLONE ACETATE 40 MG/ML INJ SUSP (RADIOLOG
80.0000 mg | Freq: Once | INTRAMUSCULAR | Status: AC
  Administered 2022-02-04: 80 mg via EPIDURAL

## 2022-02-04 MED ORDER — IOPAMIDOL (ISOVUE-M 200) INJECTION 41%
1.0000 mL | Freq: Once | INTRAMUSCULAR | Status: AC
  Administered 2022-02-04: 1 mL via EPIDURAL

## 2022-02-04 NOTE — Discharge Instructions (Signed)

## 2022-02-09 ENCOUNTER — Other Ambulatory Visit: Payer: Medicare Other

## 2022-05-06 ENCOUNTER — Ambulatory Visit (INDEPENDENT_AMBULATORY_CARE_PROVIDER_SITE_OTHER): Payer: Medicare Other | Admitting: Sports Medicine

## 2022-05-06 VITALS — BP 120/78 | HR 73 | Ht 68.0 in | Wt 209.0 lb

## 2022-05-06 DIAGNOSIS — G8929 Other chronic pain: Secondary | ICD-10-CM

## 2022-05-06 DIAGNOSIS — M5030 Other cervical disc degeneration, unspecified cervical region: Secondary | ICD-10-CM | POA: Diagnosis not present

## 2022-05-06 DIAGNOSIS — M542 Cervicalgia: Secondary | ICD-10-CM

## 2022-05-06 DIAGNOSIS — M25511 Pain in right shoulder: Secondary | ICD-10-CM

## 2022-05-06 MED ORDER — CYCLOBENZAPRINE HCL 5 MG PO TABS: 5.0000 mg | ORAL_TABLET | Freq: Every day | ORAL | 0 refills | Status: DC | PRN

## 2022-05-06 NOTE — Patient Instructions (Addendum)
Good to see you  Shoulder and neck HEP Tylenol [phone] mg 2-3 as needed for pain relief  Flexeril  5  mg nightly as needed  PT referral  As needed follow up

## 2022-05-06 NOTE — Progress Notes (Signed)
Collin Barnes D.Collin Barnes [facility] Phone: [phone]   Assessment and Plan:     1. Neck pain 2. DDD (degenerative disc disease), cervical -Chronic with exacerbation, initial sports medicine visit - Acute on chronic flare of neck pain, likely flared by patient sleeping wrong causing right-sided cervical paraspinal muscle strain - Reviewed urgent care imaging which showed degenerative changes throughout cervical spine, however no acute fracture or vertebral collapse. - Reassuring the patient has had moderate improvement in symptoms with using Flexeril.  We will provide a refill of Flexeril 5 mg that patient may use as needed at night.  Warned patient that medication may make him drowsy or sleepy, so recommend having a cane or walker at bedside at night to use if needed - Start HEP for neck - Patient will try HEP first, and external referral to physical therapy provided in case he does not feel significant improvement. - Start Tylenol 500 to 1000 mg tablets 2-3 times a day as needed for day-to-day pain relief - Ambulatory referral to Physical Therapy  3. Chronic right shoulder pain -Chronic, stable, initial sports medicine visit - Patient will have intermittent flares of sharp right shoulder pain with activity.  No significant pain currently.  Patient is an active golfer and wants to make sure there is nothing wrong with his shoulder or anything more that he can do to prevent further pain. - Start Tylenol 500 to 1000 mg tablets 2-3 times a day as needed for day-to-day pain relief  - Reviewed urgent care x-ray images which were relatively unremarkable - Ambulatory referral to Physical Therapy  Other orders - cyclobenzaprine (FLEXERIL) 5 MG tablet; Take 1 tablet (5 mg total) by mouth daily as needed for muscle spasms.    Pertinent previous records reviewed include external x-ray images provided on CD by patient for right  shoulder and neck from urgent care   Follow Up: As needed if no improvement or worsening of symptoms.  Could discuss physical therapy   Subjective:   I, Collin Barnes, am serving as a Education administrator for Collin Barnes  Chief Complaint: neck and right shoulder pain   HPI:   05/06/22 Patient is a 82 year old male complaining of neck and right shoulder pain. Patient states he slept on it funny 2 weeks ago , went to UC was given flexeril and has used tramadol, he thinks its a pinched nerve, pain is decreasing , decreased ROM due to pain and tightness, no numbness or tingling, shoulder ROM WNL, is able to play golf, lift weights and walk on the treadmill, voltaren helps  Relevant Historical Information:  Hypothyroidism, hypertension, GERD  Additional pertinent review of systems negative.   Current Outpatient Medications:    Apoaequorin (PREVAGEN PO), Take by mouth., Disp: , Rfl:    Biotin 1000 MCG CHEW, Chew by mouth., Disp: , Rfl:    Calcium Carb-Cholecalciferol (CALCIUM/VITAMIN D PO), Take by mouth., Disp: , Rfl:    chlordiazePOXIDE (LIBRIUM) 10 MG capsule, Take 1 capsule (10 mg total) by mouth daily with breakfast., Disp: 90 capsule, Rfl: 3   cyanocobalamin (,VITAMIN B-12,) 1000 MCG/ML injection, Inject 1,000 mcg into the muscle every 30 (thirty) days., Disp: , Rfl:    cyclobenzaprine (FLEXERIL) 5 MG tablet, Take 1 tablet (5 mg total) by mouth daily as needed for muscle spasms., Disp: 20 tablet, Rfl: 0   diphenhydrAMINE (SOMINEX) 25 MG tablet, Take 25 mg by mouth at bedtime as needed  for sleep., Disp: , Rfl:    FERROUS SULFATE PO, Take by mouth., Disp: , Rfl:    finasteride (PROSCAR) 5 MG tablet, Take 1 tablet (5 mg total) by mouth daily., Disp: 90 tablet, Rfl: 3   gabapentin (NEURONTIN) 100 MG capsule, Take 2 capsules (200 mg total) by mouth at bedtime., Disp: 180 capsule, Rfl: 0   hydrochlorothiazide (HYDRODIURIL) 25 MG tablet, Take 1 tablet (25 mg total) by mouth daily., Disp: 90  tablet, Rfl: 3   Lactobacillus-Inulin (CULTURELLE DIGESTIVE DAILY PO), Take by mouth., Disp: , Rfl:    levothyroxine (SYNTHROID) 75 MCG tablet, Take 1 tablet (75 mcg total) by mouth daily before breakfast., Disp: 90 tablet, Rfl: 3   Lutein 40 MG CAPS, Take by mouth., Disp: , Rfl:    Multiple Vitamin (MULTIVITAMIN ADULT PO), Take by mouth., Disp: , Rfl:    Omega-3 Fatty Acids (FISH OIL) 1000 MG CAPS, Take by mouth., Disp: , Rfl:    omeprazole (PRILOSEC) 40 MG capsule, Take 1 capsule (40 mg total) by mouth daily., Disp: 90 capsule, Rfl: 3   rosuvastatin (CRESTOR) 10 MG tablet, Take 1 tablet (10 mg total) by mouth daily., Disp: 90 tablet, Rfl: 3   tamsulosin (FLOMAX) 0.4 MG CAPS capsule, Take 1 capsule (0.4 mg total) by mouth daily., Disp: 30 capsule, Rfl: 3   vitamin C (ASCORBIC ACID) 500 MG tablet, Take 500 mg by mouth daily., Disp: , Rfl:    atenolol (TENORMIN) 25 MG tablet, Take 1 tablet (25 mg total) by mouth daily., Disp: 90 tablet, Rfl: 3   clotrimazole (LOTRIMIN) 1 % cream, Apply topically 2 (two) times daily. (Patient not taking: Reported on 07/14/2021), Disp: , Rfl:    mupirocin ointment (BACTROBAN) 2 %, For decolonization, apply with cotton tip applicator to each nare once daily for 7 days.  For abscess treatment, apply to affected area twice daily for 10 days. (Patient not taking: Reported on 07/14/2021), Disp: 30 g, Rfl: 0   nystatin (MYCOSTATIN/NYSTOP) powder, Apply 1 application. topically 3 (three) times daily. (Patient not taking: Reported on 07/14/2021), Disp: 15 g, Rfl: 1   potassium chloride SA (KLOR-CON M) 20 MEQ tablet, Take 1 tablet (20 mEq total) by mouth 2 (two) times daily for 3 days., Disp: 6 tablet, Rfl: 0   Objective:     Vitals:   05/06/22 0816  BP: 120/78  Pulse: 73  SpO2: 99%  Weight: 209 lb (94.8 kg)  Height: 5\' 8"  (1.727 m)      Body mass index is 31.78 kg/m.    Physical Exam:    Neck Exam: Cervical Spine- Posture normal Skin- normal, intact  Neuro:   Strength-  Right Left   Deltoid (C5) 5/5 5/5  Bicep/Brachioradialis (C5/6) 5/5  5/5  Wrist Extension (C6) 5/5 5/5  Tricep (C7) 5/5 5/5  Wrist Flexion (C7) 5/5 5/5  Grip (C8) 5/5 5/5  Finger Abduction (T1) 5/5 5/5   Sensation: intact to light touch in upper extremities bilaterally  Spurling's:  negative bilaterally Neck ROM: Decreased right rotation, otherwise full active ROM TTP: Right cervical paraspinal NTTP: cervical spinous processes, left cervical paraspinal, thoracic paraspinal, trapezius   Gen: Appears well, nad, nontoxic and pleasant Neuro:sensation intact, strength is 5/5 with df/pf/inv/ev, muscle tone wnl Skin: no suspicious lesion or defmority Psych: A&O, appropriate mood and affect  Right shoulder:  No deformity, swelling or muscle wasting No scapular winging FF 180, abd 180, int 0, ext 90 NTTP over the [place], clavicle, ac, coracoid, biceps groove,  humerus, deltoid, trapezius, cervical spine Neg neer, hawkins, empty can, obriens, crossarm, subscap liftoff, speeds Neg ant drawer, sulcus sign, apprehension     Electronically signed by:  Collin Barnes D.[facility] 9:25 AM 05/06/22

## 2022-05-24 ENCOUNTER — Telehealth: Payer: Self-pay | Admitting: Cardiology

## 2022-05-24 DIAGNOSIS — I10 Essential (primary) hypertension: Secondary | ICD-10-CM

## 2022-05-24 MED ORDER — ATENOLOL 25 MG PO TABS: 25.0000 mg | ORAL_TABLET | Freq: Every day | ORAL | 0 refills | Status: DC

## 2022-05-24 NOTE — Telephone Encounter (Signed)
Rx(s) sent to pharmacy electronically.  

## 2022-05-24 NOTE — Telephone Encounter (Signed)
*  STAT* If patient is at the pharmacy, call can be transferred to refill team.   1. Which medications need to be refilled? (please list name of each medication and dose if known) atenolol (TENORMIN) 25 MG tablet (Expired)   2. Which pharmacy/location (including street and city if local pharmacy) is medication to be sent to?   [id number] - [place], [place] - [address] Phone: [phone]  Fax: [phone]      3. Do they need a 30 day or 90 day supply? 90

## 2022-05-26 ENCOUNTER — Telehealth: Payer: Self-pay | Admitting: Cardiology

## 2022-05-26 DIAGNOSIS — I351 Nonrheumatic aortic (valve) insufficiency: Secondary | ICD-10-CM

## 2022-05-26 NOTE — Telephone Encounter (Signed)
Patient is calling to get orders for his yearly echo. He states he has follow-up papers that are telling him to schedule before his next appt.

## 2022-05-26 NOTE — Telephone Encounter (Signed)
Order entered for ECHO in June 2024. Front desk staff notified to schedule ECHO.  Jim Like MHA RN CCM

## 2022-05-27 ENCOUNTER — Telehealth (HOSPITAL_BASED_OUTPATIENT_CLINIC_OR_DEPARTMENT_OTHER): Payer: Self-pay | Admitting: Cardiology

## 2022-05-27 NOTE — Telephone Encounter (Signed)
Left message for patient to call and schedule the Echocardiogram ordered by Dr. Cristal Deer that is due in June 2024

## 2022-07-09 ENCOUNTER — Other Ambulatory Visit: Payer: Self-pay | Admitting: Emergency Medicine

## 2022-07-14 ENCOUNTER — Ambulatory Visit (INDEPENDENT_AMBULATORY_CARE_PROVIDER_SITE_OTHER): Payer: Medicare Other

## 2022-07-14 DIAGNOSIS — I351 Nonrheumatic aortic (valve) insufficiency: Secondary | ICD-10-CM | POA: Diagnosis not present

## 2022-07-14 LAB — ECHOCARDIOGRAM COMPLETE
AV Vena cont: 0.3 cm
Area-P 1/2: 3.27 cm2
MV M vel: 2.78 m/s
MV Peak grad: 30.9 mmHg
P 1/2 time: 995 msec
S' Lateral: 2.77 cm

## 2022-07-16 ENCOUNTER — Encounter (HOSPITAL_BASED_OUTPATIENT_CLINIC_OR_DEPARTMENT_OTHER): Payer: Self-pay | Admitting: Cardiology

## 2022-07-16 ENCOUNTER — Encounter (HOSPITAL_BASED_OUTPATIENT_CLINIC_OR_DEPARTMENT_OTHER): Payer: Self-pay

## 2022-07-16 ENCOUNTER — Ambulatory Visit (INDEPENDENT_AMBULATORY_CARE_PROVIDER_SITE_OTHER): Payer: Medicare Other | Admitting: Cardiology

## 2022-07-16 VITALS — BP 130/66 | HR 56 | Ht 69.0 in | Wt 206.6 lb

## 2022-07-16 DIAGNOSIS — R001 Bradycardia, unspecified: Secondary | ICD-10-CM

## 2022-07-16 DIAGNOSIS — I351 Nonrheumatic aortic (valve) insufficiency: Secondary | ICD-10-CM | POA: Diagnosis not present

## 2022-07-16 DIAGNOSIS — I700 Atherosclerosis of aorta: Secondary | ICD-10-CM | POA: Diagnosis not present

## 2022-07-16 DIAGNOSIS — I2510 Atherosclerotic heart disease of native coronary artery without angina pectoris: Secondary | ICD-10-CM

## 2022-07-16 DIAGNOSIS — I10 Essential (primary) hypertension: Secondary | ICD-10-CM

## 2022-07-16 DIAGNOSIS — E7800 Pure hypercholesterolemia, unspecified: Secondary | ICD-10-CM

## 2022-07-16 MED ORDER — ATENOLOL 25 MG PO TABS: 25.0000 mg | ORAL_TABLET | Freq: Every day | ORAL | 0 refills | Status: DC

## 2022-07-16 MED ORDER — ROSUVASTATIN CALCIUM 10 MG PO TABS
10.0000 mg | ORAL_TABLET | Freq: Every day | ORAL | 3 refills | Status: DC
Start: 2022-07-16 — End: 2022-07-16

## 2022-07-16 MED ORDER — ATENOLOL 25 MG PO TABS
25.0000 mg | ORAL_TABLET | Freq: Every day | ORAL | 3 refills | Status: DC
Start: 2022-07-16 — End: 2023-07-11

## 2022-07-16 MED ORDER — ROSUVASTATIN CALCIUM 10 MG PO TABS
10.0000 mg | ORAL_TABLET | Freq: Every day | ORAL | 3 refills | Status: DC
Start: 2022-07-16 — End: 2023-07-11

## 2022-07-16 MED ORDER — ATENOLOL 25 MG PO TABS
25.0000 mg | ORAL_TABLET | Freq: Every day | ORAL | 3 refills | Status: DC
Start: 2022-07-16 — End: 2022-07-16

## 2022-07-16 MED ORDER — ROSUVASTATIN CALCIUM 10 MG PO TABS: 10.0000 mg | ORAL_TABLET | Freq: Every day | ORAL | 3 refills | Status: DC

## 2022-07-16 NOTE — Patient Instructions (Signed)
Medication Instructions:  Your physician recommends that you continue on your current medications as directed. Please refer to the Current Medication list given to you today.  *If you need a refill on your cardiac medications before your next appointment, please call your pharmacy*  Follow-Up: At [facility], you and your health needs are our priority.  As part of our continuing mission to provide you with exceptional heart care, we have created designated Provider Care Teams.  These Care Teams include your primary Cardiologist (physician) and Advanced Practice Providers (APPs -  Physician Assistants and Nurse Practitioners) who all work together to provide you with the care you need, when you need it.  We recommend signing up for the patient portal called "MyChart".  Sign up information is provided on this After Visit Summary.  MyChart is used to connect with patients for Virtual Visits (Telemedicine).  Patients are able to view lab/test results, encounter notes, upcoming appointments, etc.  Non-urgent messages can be sent to your provider as well.   To learn more about what you can do with MyChart, go to [email or web address].    Your next appointment:   1 year(s)  Provider:   Bridgette Christopher, MD    

## 2022-07-16 NOTE — Progress Notes (Signed)
Cardiology Office Note:    Date:  07/16/2022   ID:  Stephane Niemann, DOB [birth year], MRN [id number]  PCP:  Georgina Quint, MD  Cardiologist:  Jodelle Red, MD  Referring MD: Georgina Quint, *   CC: follow up  History of Present Illness:    Collin Barnes is a 82 y.o. male with a hx of hypertension, hyperlipidemia, coronary and aortic atherosclerosis, moderate AR, hypothyroidism, upper GI bleed (2010, 2018), anemia/thrombocytopenia s/p bone marrow biopsy 02/2020 (no MDS), anxiety, depression, and lung nodule, who is seen for follow up today. I initially met him 04/14/21 for the evaluation and management of hypertension, hyperlipidemia, and to establish care.  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 Wilson Singer, [facility], 07/28/20. Records request from Care Everywhere BP 98/50, lower than typical. Noted coronary and aortic plaque on prior CT, started on rosuvastatin. Recommended no aspirin 2/2 history of gastric surgery/bleeding risk. Stress echo ordered.  Cardiovascular risk factors: Prior clinical ASCVD:  Comorbid conditions: Hypertension - He usually notices average readings such as 130/70 at other clinic visits, including when he goes for B12 injection once a month. Today his BP is 90/50, which he notes is low and atypical for him. Remotely had systolic pressures around 158 during physicals in the [employer]. Hyperlipidemia - started on rosuvastatin 07/2020.  Prior cardiac testing and/or incidental findings on other testing (ie coronary calcium): CT chest 07/21/20, Echo 07/21/20, Echo 09/06/19, Stress echo 07/29/20 Exercise level: Normally his resting heart rate is 60-65 bpm. Uses the treadmill for 30 mins, 3-4 days a week. Afterwards he may have some fatigue, but he does not feel exhausted. Enjoys golf 3 days a week. Also performs general back exercises, some weight lifting. He used to be very lightheaded in the past, not much lately. He  would feel lightheadedness for a few steps when standing up after sitting and reading the paper. Current diet: "I eat anything." His wife generally cooks healthy meals, including salmon.  Today: Brings a copy of prior records, will scan today. Feeling better with reduced dose of atenolol. Reviewed echo today.  Feeling well. Plays golf, walks on treadmill without symptoms.  Denies chest pain, shortness of breath at rest or with normal exertion. No PND, orthopnea, LE edema or unexpected weight gain. No syncope or palpitations.   History reviewed. No pertinent past medical history.  History reviewed. No pertinent surgical history.  Current Medications: Current Outpatient Medications on File Prior to Visit  Medication Sig   Apoaequorin (PREVAGEN PO) Take by mouth.   Biotin 1000 MCG CHEW Chew by mouth.   Calcium Carb-Cholecalciferol (CALCIUM/VITAMIN D PO) Take by mouth.   cyanocobalamin (,VITAMIN B-12,) 1000 MCG/ML injection Inject 1,000 mcg into the muscle every 30 (thirty) days.   diphenhydrAMINE (SOMINEX) 25 MG tablet Take 25 mg by mouth at bedtime as needed for sleep.   FERROUS SULFATE PO Take by mouth.   finasteride (PROSCAR) 5 MG tablet Take 1 tablet (5 mg total) by mouth daily.   hydrochlorothiazide (HYDRODIURIL) 25 MG tablet Take 1 tablet (25 mg total) by mouth daily.   Lactobacillus-Inulin (CULTURELLE DIGESTIVE DAILY PO) Take by mouth.   levothyroxine (SYNTHROID) 75 MCG tablet TAKE 1 TABLET BY MOUTH EVERY DAY BEFORE BREAKFAST   Lutein 40 MG CAPS Take by mouth.   Multiple Vitamin (MULTIVITAMIN ADULT PO) Take by mouth.   Omega-3 Fatty Acids (FISH OIL) 1000 MG CAPS Take by mouth.   omeprazole (PRILOSEC) 40 MG capsule TAKE 1  CAPSULE BY MOUTH EVERY DAY   tamsulosin (FLOMAX) 0.4 MG CAPS capsule Take 1 capsule (0.4 mg total) by mouth daily.   vitamin C (ASCORBIC ACID) 500 MG tablet Take 500 mg by mouth daily.   No current facility-administered medications on file prior to visit.      Allergies:   Patient has no known allergies.   Social History   Tobacco Use   Smoking status: Never   Smokeless tobacco: Never  Substance Use Topics   Alcohol use: Never   Drug use: Never    Family History: Father died age 28 of CHF but had not had any issues prior to his death.  ROS:   Please see the history of present illness.  Additional pertinent ROS otherwise unremarkable.  EKGs/Labs/Other Studies Reviewed:    The following studies were reviewed today: Echo 07/16/22: 1. Left ventricular ejection fraction, by estimation, is 65 to 70%. The  left ventricle has normal function. The left ventricle has no regional  wall motion abnormalities. There is mild concentric left ventricular  hypertrophy. Left ventricular diastolic  parameters are consistent with Grade II diastolic dysfunction  (pseudonormalization). Elevated left ventricular end-diastolic pressure.  The average left ventricular global longitudinal strain is -18.4 %. The  global longitudinal strain is normal.   2. Right ventricular systolic function is normal. The right ventricular  size is normal.   3. Left atrial size was moderately dilated.   4. The mitral valve is normal in structure. Trivial mitral valve  regurgitation. No evidence of mitral stenosis.   5. Aortic regurgitation pressure half time 589 ms. The aortic valve is  tricuspid. Aortic valve regurgitation is mild. No aortic stenosis is  present.   6. The inferior vena cava is normal in size with greater than 50%  respiratory variability, suggesting right atrial pressure of 3 mmHg.   Comparison(s): Echocardiogram done 09/06/19 showed an EF of 70-75%.   Stress echo 07/29/20 Final Impressions:   1. Maximum stress test with 85.8% of age predicted maximum heart rate achieved.   2. During stress exam the patient developed fatigue.   3. There were no ischemic changes by EKG during stress.   4. Post stress, decreased left ventricular size, increased global  systolic function with an estimated EF of >75%.   5. Negative stress echo for ischemia.   CT chest 07/21/20 1. A 9.5 mm noncalcified nodule lower lobe right lung and  a 5.7 mm subpleural noncalcified nodule lower lobe right lung; stable when compared to 09/15/2019; followup chest CT in 6 months duration suggested.  2. Tiny calcified granuloma right middle lobe ;stable in appearance.  3. Status post cholecystectomy and gastric bypass surgery.  4. Coronary artery calcifications.   Echo 07/21/2020 Final Impressions: 1. Normal LV size, borderline wall thickness, estimated EF of 55 - 60%. 2. Moderately enlarged left atrium. 3. The aortic valve is normal and trileaflet, no stenosis and mild regurgitation. 4. No significant valve disease detected.   Comparison Compared to prior exam of 09/06/19, there has been no significant change.  Echo 09/06/2019 Final Impressions:  1. Normal LV size, normal global systolic function with an estimated EF of 70 - 75%.  2. Right ventricular cavity size is normal, global systolic RV function is normal.  3. Moderately enlarged left atrium.  4. The aortic valve is trileaflet and sclerotic, no stenosis and mild to moderate regurgitation.   Comparison Compared to prior exam of 08/10/18, there has been no significant change.   EKG:  EKG  is personally reviewed.   07/16/2022: sinus bradycardia at 56 bpm 05/04/2021: sinus bradycardia/sinus arrhythmia at 49 bpm  Recent Labs: No results found for requested labs within last 365 days.   Recent Lipid Panel    Component Value Date/Time   CHOL 114 07/14/2021 1158   TRIG 69.0 07/14/2021 1158   HDL 63.70 07/14/2021 1158   CHOLHDL 2 07/14/2021 1158   VLDL 13.8 07/14/2021 1158   LDLCALC 37 07/14/2021 1158    Physical Exam:    VS:  BP 130/66   Pulse (!) 56   Ht 5\' 9"  (1.753 m)   Wt 206 lb 9.6 oz (93.7 kg)   SpO2 97%   BMI 30.51 kg/m     Wt Readings from Last 3 Encounters:  07/16/22 206 lb 9.6 oz (93.7 kg)   05/06/22 209 lb (94.8 kg)  01/26/22 207 lb (93.9 kg)    GEN: Well nourished, well developed in no acute distress HEENT: Normal, moist mucous membranes NECK: No JVD CARDIAC: regular rhythm, normal S1 and S2, no rubs or gallops. No murmur. VASCULAR: Radial and DP pulses 2+ bilaterally. No carotid bruits RESPIRATORY:  Clear to auscultation without rales, wheezing or rhonchi  ABDOMEN: Soft, non-tender, non-distended MUSCULOSKELETAL:  Ambulates independently SKIN: Warm and dry, no edema NEUROLOGIC:  Alert and oriented x 3. No focal neuro deficits noted. PSYCHIATRIC:  Normal affect    ASSESSMENT:    1. Nonrheumatic aortic (valve) insufficiency   2. Coronary artery calcification seen on CT scan   3. Aortic atherosclerosis (HCC)   4. Essential hypertension   5. Sinus bradycardia   6. Pure hypercholesterolemia     PLAN:    sinus bradycardia: -asymptomatic -feeling better with decrease in atenolol to 25 mg daily  History of hypertension, with prior hypotension: -on atenolol (decreased as above) and HCTZ. Reports good PO intake, had previously had HCTZ stopped but BP rose  Coronary calcification, consistent with nonobstructive CAD Aortic atherosclerosis Hypercholesterolemia, LDL goal <70 -tolerating rosuvastatin -no aspirin given chronic anemia/thrombocytopenia and history of GI bleed  aortic regurgitation, now mild -recheck echo as needed now that two echoes in a row have said mild -reviewed symptoms that need urgent attention  Cardiac risk counseling and prevention recommendations: -recommend heart healthy/Mediterranean diet, with whole grains, fruits, vegetable, fish, lean meats, nuts, and olive oil. Limit salt. -recommend moderate walking, 3-5 times/week for 30-50 minutes each session. Aim for at least 150 minutes.week. Goal should be pace of 3 miles/hours, or walking 1.5 miles in 30 minutes -recommend avoidance of tobacco products. Avoid excess alcohol.  Plan for follow  up: 1 year  Jodelle Red, MD, PhD, [facility]  [facility]    Medication Adjustments/Labs and Tests Ordered: Current medicines are reviewed at length with the patient today.  Concerns regarding medicines are outlined above.   Orders Placed This Encounter  Procedures   EKG 12-Lead   Meds ordered this encounter  Medications   DISCONTD: atenolol (TENORMIN) 25 MG tablet    Sig: Take 1 tablet (25 mg total) by mouth daily.    Dispense:  90 tablet    Refill:  0   DISCONTD: rosuvastatin (CRESTOR) 10 MG tablet    Sig: Take 1 tablet (10 mg total) by mouth daily.    Dispense:  90 tablet    Refill:  3   DISCONTD: atenolol (TENORMIN) 25 MG tablet    Sig: Take 1 tablet (25 mg total) by mouth daily.    Dispense:  90 tablet  Refill:  3   DISCONTD: rosuvastatin (CRESTOR) 10 MG tablet    Sig: Take 1 tablet (10 mg total) by mouth daily.    Dispense:  90 tablet    Refill:  3   atenolol (TENORMIN) 25 MG tablet    Sig: Take 1 tablet (25 mg total) by mouth daily.    Dispense:  90 tablet    Refill:  3   rosuvastatin (CRESTOR) 10 MG tablet    Sig: Take 1 tablet (10 mg total) by mouth daily.    Dispense:  90 tablet    Refill:  3   Patient Instructions  Medication Instructions:  Your physician recommends that you continue on your current medications as directed. Please refer to the Current Medication list given to you today.  *If you need a refill on your cardiac medications before your next appointment, please call your pharmacy*   Follow-Up: At [facility], you and your health needs are our priority.  As part of our continuing mission to provide you with exceptional heart care, we have created designated Provider Care Teams.  These Care Teams include your primary Cardiologist (physician) and Advanced Practice Providers (APPs -  Physician Assistants and Nurse Practitioners) who all work together to provide you with the care you need, when you need it.  We  recommend signing up for the patient portal called "MyChart".  Sign up information is provided on this After Visit Summary.  MyChart is used to connect with patients for Virtual Visits (Telemedicine).  Patients are able to view lab/test results, encounter notes, upcoming appointments, etc.  Non-urgent messages can be sent to your provider as well.   To learn more about what you can do with MyChart, go to [email or web address].    Your next appointment:   1 year(s)  Provider:   Jodelle Red, MD      Signed, Jodelle Red, MD PhD 07/16/2022 1:13 PM    [facility]

## 2022-07-19 ENCOUNTER — Ambulatory Visit (INDEPENDENT_AMBULATORY_CARE_PROVIDER_SITE_OTHER): Payer: Medicare Other | Admitting: Emergency Medicine

## 2022-07-19 ENCOUNTER — Encounter: Payer: Self-pay | Admitting: Emergency Medicine

## 2022-07-19 VITALS — BP 128/68 | HR 54 | Temp 97.5°F | Ht 69.0 in | Wt 207.0 lb

## 2022-07-19 DIAGNOSIS — R911 Solitary pulmonary nodule: Secondary | ICD-10-CM

## 2022-07-19 DIAGNOSIS — E785 Hyperlipidemia, unspecified: Secondary | ICD-10-CM | POA: Diagnosis not present

## 2022-07-19 DIAGNOSIS — I10 Essential (primary) hypertension: Secondary | ICD-10-CM | POA: Diagnosis not present

## 2022-07-19 DIAGNOSIS — E039 Hypothyroidism, unspecified: Secondary | ICD-10-CM

## 2022-07-19 DIAGNOSIS — N138 Other obstructive and reflux uropathy: Secondary | ICD-10-CM

## 2022-07-19 DIAGNOSIS — D696 Thrombocytopenia, unspecified: Secondary | ICD-10-CM

## 2022-07-19 DIAGNOSIS — I700 Atherosclerosis of aorta: Secondary | ICD-10-CM | POA: Diagnosis not present

## 2022-07-19 DIAGNOSIS — K219 Gastro-esophageal reflux disease without esophagitis: Secondary | ICD-10-CM

## 2022-07-19 DIAGNOSIS — N401 Enlarged prostate with lower urinary tract symptoms: Secondary | ICD-10-CM

## 2022-07-19 DIAGNOSIS — F411 Generalized anxiety disorder: Secondary | ICD-10-CM

## 2022-07-19 LAB — CBC WITH DIFFERENTIAL/PLATELET
Basophils Absolute: 0.1 10*3/uL (ref 0.0–0.1)
Basophils Relative: 1.1 % (ref 0.0–3.0)
Eosinophils Absolute: 0 10*3/uL (ref 0.0–0.7)
Eosinophils Relative: 0.9 % (ref 0.0–5.0)
HCT: 40.1 % (ref 39.0–52.0)
Hemoglobin: 13.3 g/dL (ref 13.0–17.0)
Lymphocytes Relative: 29.2 % (ref 12.0–46.0)
Lymphs Abs: 1.6 10*3/uL (ref 0.7–4.0)
MCHC: 33 g/dL (ref 30.0–36.0)
MCV: 94.2 fl (ref 78.0–100.0)
Monocytes Absolute: 0.4 10*3/uL (ref 0.1–1.0)
Monocytes Relative: 7.1 % (ref 3.0–12.0)
Neutro Abs: 3.3 10*3/uL (ref 1.4–7.7)
Neutrophils Relative %: 61.7 % (ref 43.0–77.0)
Platelets: 120 10*3/uL — ABNORMAL LOW (ref 150.0–400.0)
RBC: 4.26 Mil/uL (ref 4.22–5.81)
RDW: 15.3 % (ref 11.5–15.5)
WBC: 5.4 10*3/uL (ref 4.0–10.5)

## 2022-07-19 LAB — COMPREHENSIVE METABOLIC PANEL
ALT: 24 U/L (ref 0–53)
AST: 25 U/L (ref 0–37)
Albumin: 4.5 g/dL (ref 3.5–5.2)
Alkaline Phosphatase: 60 U/L (ref 39–117)
BUN: 23 mg/dL (ref 6–23)
CO2: 29 mEq/L (ref 19–32)
Calcium: 10.2 mg/dL (ref 8.4–10.5)
Chloride: 101 mEq/L (ref 96–112)
Creatinine, Ser: 0.98 mg/dL (ref 0.40–1.50)
GFR: 71.42 mL/min (ref >60.00)
Glucose, Bld: 106 mg/dL — ABNORMAL HIGH (ref 70–99)
Potassium: 4.5 mEq/L (ref 3.5–5.1)
Sodium: 138 mEq/L (ref 135–145)
Total Bilirubin: 0.8 mg/dL (ref 0.2–1.2)
Total Protein: 7.1 g/dL (ref 6.0–8.3)

## 2022-07-19 LAB — LIPID PANEL
Cholesterol: 124 mg/dL (ref 0–200)
HDL: 70.1 mg/dL (ref >39.00)
LDL Cholesterol: 42 mg/dL (ref 0–99)
NonHDL: 53.88
Total CHOL/HDL Ratio: 2
Triglycerides: 60 mg/dL (ref 0.0–149.0)
VLDL: 12 mg/dL (ref 0.0–40.0)

## 2022-07-19 LAB — TSH: TSH: 2.49 u[IU]/mL (ref 0.35–5.50)

## 2022-07-19 MED ORDER — FINASTERIDE 5 MG PO TABS: 5.0000 mg | ORAL_TABLET | Freq: Every day | ORAL | 3 refills | Status: DC

## 2022-07-19 MED ORDER — HYDROCHLOROTHIAZIDE 25 MG PO TABS: 25.0000 mg | ORAL_TABLET | Freq: Every day | ORAL | 3 refills | Status: DC

## 2022-07-19 MED ORDER — OMEPRAZOLE 40 MG PO CPDR: 40.0000 mg | DELAYED_RELEASE_CAPSULE | Freq: Every day | ORAL | 3 refills | Status: DC

## 2022-07-19 MED ORDER — LEVOTHYROXINE SODIUM 75 MCG PO TABS: 75.0000 ug | ORAL_TABLET | Freq: Every day | ORAL | 3 refills | Status: DC

## 2022-07-19 MED ORDER — CHLORDIAZEPOXIDE HCL 10 MG PO CAPS: 10.0000 mg | ORAL_CAPSULE | Freq: Three times a day (TID) | ORAL | 1 refills | Status: DC | PRN

## 2022-07-19 MED ORDER — TAMSULOSIN HCL 0.4 MG PO CAPS: 0.4000 mg | ORAL_CAPSULE | Freq: Every day | ORAL | 3 refills | Status: DC

## 2022-07-19 NOTE — Assessment & Plan Note (Signed)
Clinically stable.  Diet and nutrition discussed. Lipid profile done today. Continue rosuvastatin 10 mg daily

## 2022-07-19 NOTE — Assessment & Plan Note (Signed)
Clinically euthyroid.  TSH done today. Continue Synthroid 75 mcg daily. 

## 2022-07-19 NOTE — Assessment & Plan Note (Signed)
Stable and asymptomatic.  No concerns. Continues omeprazole 40 mg daily as needed

## 2022-07-19 NOTE — Assessment & Plan Note (Signed)
BP Readings from Last 3 Encounters:  07/19/22 128/68  07/16/22 130/66  05/06/22 120/78  Well-controlled hypertension Continue atenolol 25 mg daily.  Dose was recently decreased by cardiologist.  Continue hydrochlorothiazide 25 mg daily Benefits of exercise discussed Cardiovascular risks associated with hypertension discussed Dietary approaches to stop hypertension discussed

## 2022-07-19 NOTE — Assessment & Plan Note (Signed)
History of pulmonary nodule Recommend pulmonary nodule clinic evaluation Referral placed today

## 2022-07-19 NOTE — Assessment & Plan Note (Signed)
Diet and nutrition discussed. Continue rosuvastatin 10 mg daily.  

## 2022-07-19 NOTE — Assessment & Plan Note (Signed)
No clinical bleeding episodes. States he follows up with hematologist in [place] CBC done today.

## 2022-07-19 NOTE — Patient Instructions (Signed)
Health Maintenance After Age 83 After age 83, you are at a higher risk for certain long-term diseases and infections as well as injuries from falls. Falls are a major cause of broken bones and head injuries in people who are older than age 83. Getting regular preventive care can help to keep you healthy and well. Preventive care includes getting regular testing and making lifestyle changes as recommended by your health care provider. Talk with your health care provider about: Which screenings and tests you should have. A screening is a test that checks for a disease when you have no symptoms. A diet and exercise plan that is right for you. What should I know about screenings and tests to prevent falls? Screening and testing are the best ways to find a health problem early. Early diagnosis and treatment give you the best chance of managing medical conditions that are common after age 83. Certain conditions and lifestyle choices may make you more likely to have a fall. Your health care provider may recommend: Regular vision checks. Poor vision and conditions such as cataracts can make you more likely to have a fall. If you wear glasses, make sure to get your prescription updated if your vision changes. Medicine review. Work with your health care provider to regularly review all of the medicines you are taking, including over-the-counter medicines. Ask your health care provider about any side effects that may make you more likely to have a fall. Tell your health care provider if any medicines that you take make you feel dizzy or sleepy. Strength and balance checks. Your health care provider may recommend certain tests to check your strength and balance while standing, walking, or changing positions. Foot health exam. Foot pain and numbness, as well as not wearing proper footwear, can make you more likely to have a fall. Screenings, including: Osteoporosis screening. Osteoporosis is a condition that causes  the bones to get weaker and break more easily. Blood pressure screening. Blood pressure changes and medicines to control blood pressure can make you feel dizzy. Depression screening. You may be more likely to have a fall if you have a fear of falling, feel depressed, or feel unable to do activities that you used to do. Alcohol use screening. Using too much alcohol can affect your balance and may make you more likely to have a fall. Follow these instructions at home: Lifestyle Do not drink alcohol if: Your health care provider tells you not to drink. If you drink alcohol: Limit how much you have to: 0-1 drink a day for women. 0-2 drinks a day for men. Know how much alcohol is in your drink. In the [place] one drink equals one 12 oz bottle of beer (355 mL), one 5 oz glass of wine (148 mL), or one 1 oz glass of hard liquor (44 mL). Do not use any products that contain nicotine or tobacco. These products include cigarettes, chewing tobacco, and vaping devices, such as e-cigarettes. If you need help quitting, ask your health care provider. Activity  Follow a regular exercise program to stay fit. This will help you maintain your balance. Ask your health care provider what types of exercise are appropriate for you. If you need a cane or walker, use it as recommended by your health care provider. Wear supportive shoes that have nonskid soles. Safety  Remove any tripping hazards, such as rugs, cords, and clutter. Install safety equipment such as grab bars in bathrooms and safety rails on stairs. Keep rooms and walkways   well-lit. General instructions Talk with your health care provider about your risks for falling. Tell your health care provider if: You fall. Be sure to tell your health care provider about all falls, even ones that seem minor. You feel dizzy, tiredness (fatigue), or off-balance. Take over-the-counter and prescription medicines only as told by your health care provider. These include  supplements. Eat a healthy diet and maintain a healthy weight. A healthy diet includes low-fat dairy products, low-fat (lean) meats, and fiber from whole grains, beans, and lots of fruits and vegetables. Stay current with your vaccines. Schedule regular health, dental, and eye exams. Summary Having a healthy lifestyle and getting preventive care can help to protect your health and wellness after age 83. Screening and testing are the best way to find a health problem early and help you avoid having a fall. Early diagnosis and treatment give you the best chance for managing medical conditions that are more common for people who are older than age 83. Falls are a major cause of broken bones and head injuries in people who are older than age 83. Take precautions to prevent a fall at home. Work with your health care provider to learn what changes you can make to improve your health and wellness and to prevent falls. This information is not intended to replace advice given to you by your health care provider. Make sure you discuss any questions you have with your health care provider. Document Revised: 06/16/2020 Document Reviewed: 06/16/2020 Elsevier Patient Education  2024 [employer]  

## 2022-07-19 NOTE — Assessment & Plan Note (Addendum)
Stable.  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 Continue tamsulosin 0.4 mg daily

## 2022-07-19 NOTE — Assessment & Plan Note (Signed)
States he has been on Librium 10 mg daily for many years At this point need to continue 10 mg daily. Chronic condition is stable.

## 2022-07-19 NOTE — Progress Notes (Signed)
Collin Barnes 82 y.o.   Chief Complaint  Patient presents with   Medical Management of Chronic Issues    1 year f/u appt,     HISTORY OF PRESENT ILLNESS: This is a 82 y.o. male here for follow-up of multiple chronic conditions Staying physically and mentally active Overall doing well.  Eating well.  Non-smoker. BP Readings from Last 3 Encounters:  07/19/22 128/68  07/16/22 130/66  05/06/22 120/78   Wt Readings from Last 3 Encounters:  07/19/22 207 lb (93.9 kg)  07/16/22 206 lb 9.6 oz (93.7 kg)  05/06/22 209 lb (94.8 kg)     HPI   Prior to Admission medications   Medication Sig Start Date End Date Taking? Authorizing Provider  Apoaequorin (PREVAGEN PO) Take by mouth.   Yes [provider]  atenolol (TENORMIN) 25 MG tablet Take 1 tablet (25 mg total) by mouth daily. 07/16/22 07/11/23 Yes Jodelle Red, MD  Biotin 1000 MCG CHEW Chew by mouth.   Yes [provider]  Calcium Carb-Cholecalciferol (CALCIUM/VITAMIN D PO) Take by mouth.   Yes [provider]  chlordiazePOXIDE (LIBRIUM) 10 MG capsule Take 10 mg by mouth 3 (three) times daily as needed for anxiety.   Yes [provider]  cyanocobalamin (,VITAMIN B-12,) 1000 MCG/ML injection Inject 1,000 mcg into the muscle every 30 (thirty) days. 05/08/21  Yes [provider]  diphenhydrAMINE (SOMINEX) 25 MG tablet Take 25 mg by mouth at bedtime as needed for sleep.   Yes [provider]  FERROUS SULFATE PO Take by mouth.   Yes [provider]  finasteride (PROSCAR) 5 MG tablet Take 1 tablet (5 mg total) by mouth daily. 07/14/21  Yes Arriyanna Mersch, Eilleen Kempf, MD  hydrochlorothiazide (HYDRODIURIL) 25 MG tablet Take 1 tablet (25 mg total) by mouth daily. 07/14/21  Yes Nykolas Bacallao, Eilleen Kempf, MD  Lactobacillus-Inulin (CULTURELLE DIGESTIVE DAILY PO) Take by mouth.   Yes [provider]  levothyroxine (SYNTHROID) 75 MCG tablet TAKE 1 TABLET BY MOUTH EVERY DAY BEFORE  BREAKFAST 07/09/22  Yes Chivas Notz, Eilleen Kempf, MD  Lutein 40 MG CAPS Take by mouth.   Yes [provider]  Multiple Vitamin (MULTIVITAMIN ADULT PO) Take by mouth.   Yes [provider]  Omega-3 Fatty Acids (FISH OIL) 1000 MG CAPS Take by mouth.   Yes [provider]  omeprazole (PRILOSEC) 40 MG capsule TAKE 1 CAPSULE BY MOUTH EVERY DAY 07/09/22  Yes Torii Royse, Eilleen Kempf, MD  rosuvastatin (CRESTOR) 10 MG tablet Take 1 tablet (10 mg total) by mouth daily. 07/16/22  Yes Jodelle Red, MD  tamsulosin (FLOMAX) 0.4 MG CAPS capsule Take 1 capsule (0.4 mg total) by mouth daily. 11/25/21  Yes Dalis Beers, Eilleen Kempf, MD  vitamin C (ASCORBIC ACID) 500 MG tablet Take 500 mg by mouth daily.   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  No past surgical history on file.  Social History   Socioeconomic History   Marital status: Married    Spouse name: Not on file   Number of children: Not on file   Years of education: Not on file   Highest education level: Master's degree (e.g., MA, MS, MEng, MEd, MSW, MBA)  Occupational History   Not on file  Tobacco  Use   Smoking status: Never   Smokeless tobacco: Never  Substance and Sexual Activity   Alcohol use: Never   Drug use: Never   Sexual activity: Not on file  Other Topics Concern   Not on file  Social History Narrative   Not on file   Social Determinants of Health   Financial Resource Strain: Low Risk  (07/15/2022)   Overall Financial Resource Strain (CARDIA)    Difficulty of Paying Living Expenses: Not hard at all  Food Insecurity: No Food Insecurity (07/15/2022)   Hunger Vital Sign    Worried About Running Out of Food in the Last Year: Never true    Ran Out of Food in the Last Year: Never true  Transportation Needs: No Transportation Needs (07/15/2022)   PRAPARE - Administrator, Civil Service (Medical): No    Lack of Transportation (Non-Medical): No  Physical Activity: Insufficiently Active (07/15/2022)   Exercise Vital Sign    Days of Exercise per Week: 4 days    Minutes of Exercise per Session: 30 min  Stress: No Stress Concern Present (07/15/2022)   [employer] of Occupational Health - Occupational Stress Questionnaire    Feeling of Stress : Not at all  Social Connections: Unknown (07/15/2022)   Social Connection and Isolation Panel [NHANES]    Frequency of Communication with Friends and Family: Three times a week    Frequency of Social Gatherings with Friends and Family: Three times a week    Attends Religious Services: More than 4 times per year    Active Member of Clubs or Organizations: Not on file    Attends Banker Meetings: Not on file    Marital Status: Married  Intimate Partner Violence: Not on file    No family history on file.   Review of Systems  Constitutional: Negative.  Negative for chills and fever.  HENT: Negative.  Negative for congestion and sore throat.   Respiratory: Negative.  Negative for cough and shortness of breath.   Cardiovascular: Negative.  Negative for chest pain and palpitations.  Gastrointestinal:  Negative for  abdominal pain, diarrhea, nausea and vomiting.  Genitourinary: Negative.  Negative for dysuria and hematuria.  Skin: Negative.  Negative for rash.  Neurological: Negative.  Negative for dizziness and headaches.  All other systems reviewed and are negative.   Vitals:   07/19/22 1048  BP: 128/68  Pulse: (!) 54  Temp: (!) 97.5 F (36.4 C)  SpO2: 98%    Physical Exam Vitals reviewed.  Constitutional:      Appearance: Normal appearance.  HENT:     Head: Normocephalic.     Mouth/Throat:     Mouth: Mucous membranes are moist.     Pharynx: Oropharynx is clear.  Eyes:     Extraocular Movements: Extraocular movements intact.     Conjunctiva/sclera: Conjunctivae normal.     Pupils: Pupils are equal, round, and reactive to light.  Cardiovascular:     Rate and Rhythm:  Normal rate and regular rhythm.     Pulses: Normal pulses.     Heart sounds: Normal heart sounds.  Pulmonary:     Effort: Pulmonary effort is normal.     Breath sounds: Normal breath sounds.  Musculoskeletal:     Cervical back: No tenderness.  Lymphadenopathy:     Cervical: No cervical adenopathy.  Skin:    General: Skin is warm and dry.     Capillary Refill: Capillary refill takes less than 2 seconds.  Neurological:     Mental Status: He is alert and oriented to person, place, and time.  Psychiatric:        Mood and Affect: Mood normal.        Behavior: Behavior normal.      ASSESSMENT & PLAN: A total of 42 minutes was spent with the patient and counseling/coordination of care regarding preparing for this visit, review of most recent office visit notes, review of multiple chronic medical conditions and their management, review of most recent blood work results, review of all medications, education on nutrition, prognosis, documentation and need for follow-up.  Problem List Items Addressed This Visit       Cardiovascular and Mediastinum   Aortic atherosclerosis (HCC)    Diet and nutrition  discussed Continue rosuvastatin 10 mg daily      Relevant Medications   hydrochlorothiazide (HYDRODIURIL) 25 MG tablet   Essential hypertension - Primary    BP Readings from Last 3 Encounters:  07/19/22 128/68  07/16/22 130/66  05/06/22 120/78  Well-controlled hypertension Continue atenolol 25 mg daily.  Dose was recently decreased by cardiologist.  Continue hydrochlorothiazide 25 mg daily Benefits of exercise discussed Cardiovascular risks associated with hypertension discussed Dietary approaches to stop hypertension discussed       Relevant Medications   hydrochlorothiazide (HYDRODIURIL) 25 MG tablet   Other Relevant Orders   CBC with Differential/Platelet   Comprehensive metabolic panel     Respiratory   Lung nodule    History of pulmonary nodule Recommend pulmonary nodule clinic evaluation Referral placed today      Relevant Orders   AMB  Referral to Pulmonary Nodule Clinic     Digestive   GERD (gastroesophageal reflux disease)    Stable and asymptomatic.  No concerns. Continues omeprazole 40 mg daily as needed      Relevant Medications   omeprazole (PRILOSEC) 40 MG capsule   Other Relevant Orders   CBC with Differential/Platelet     Endocrine   Hypothyroidism    Clinically euthyroid. TSH done today. Continue Synthroid 75 mcg daily      Relevant Medications   levothyroxine (SYNTHROID) 75 MCG tablet   Other Relevant Orders   TSH   CBC with Differential/Platelet     Genitourinary   BPH with urinary obstruction    Stable.  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 Continue tamsulosin 0.4 mg daily      Relevant Medications   finasteride (PROSCAR) 5 MG tablet   tamsulosin (FLOMAX) 0.4 MG CAPS capsule     Hematopoietic and Hemostatic   Thrombocytopenia (HCC)    No clinical bleeding episodes. States he follows up with hematologist in [place] CBC done today.      Relevant Orders   CBC with Differential/Platelet     Other   Anxiety  state    States he has been on Librium 10 mg daily for many years At this point need to continue 10 mg daily. Chronic condition is stable.  Relevant Medications   chlordiazePOXIDE (LIBRIUM) 10 MG capsule   Dyslipidemia    Clinically stable.  Diet and nutrition discussed. Lipid profile done today. Continue rosuvastatin 10 mg daily      Relevant Orders   CBC with Differential/Platelet   Comprehensive metabolic panel   Lipid panel   Patient Instructions  Health Maintenance After Age 64 After age 58, you are at a higher risk for certain long-term diseases and infections as well as injuries from falls. Falls are a major cause of broken bones and head injuries in people who are older than age 63. Getting regular preventive care can help to keep you healthy and well. Preventive care includes getting regular testing and making lifestyle changes as recommended by your health care provider. Talk with your health care provider about: Which screenings and tests you should have. A screening is a test that checks for a disease when you have no symptoms. A diet and exercise plan that is right for you. What should I know about screenings and tests to prevent falls? Screening and testing are the best ways to find a health problem early. Early diagnosis and treatment give you the best chance of managing medical conditions that are common after age 57. Certain conditions and lifestyle choices may make you more likely to have a fall. Your health care provider may recommend: Regular vision checks. Poor vision and conditions such as cataracts can make you more likely to have a fall. If you wear glasses, make sure to get your prescription updated if your vision changes. Medicine review. Work with your health care provider to regularly review all of the medicines you are taking, including over-the-counter medicines. Ask your health care provider about any side effects that may make you more likely to have a  fall. Tell your health care provider if any medicines that you take make you feel dizzy or sleepy. Strength and balance checks. Your health care provider may recommend certain tests to check your strength and balance while standing, walking, or changing positions. Foot health exam. Foot pain and numbness, as well as not wearing proper footwear, can make you more likely to have a fall. Screenings, including: Osteoporosis screening. Osteoporosis is a condition that causes the bones to get weaker and break more easily. Blood pressure screening. Blood pressure changes and medicines to control blood pressure can make you feel dizzy. Depression screening. You may be more likely to have a fall if you have a fear of falling, feel depressed, or feel unable to do activities that you used to do. Alcohol use screening. Using too much alcohol can affect your balance and may make you more likely to have a fall. Follow these instructions at home: Lifestyle Do not drink alcohol if: Your health care provider tells you not to drink. If you drink alcohol: Limit how much you have to: 0-1 drink a day for women. 0-2 drinks a day for men. Know how much alcohol is in your drink. In the [place] one drink equals one 12 oz bottle of beer (355 mL), one 5 oz glass of wine (148 mL), or one 1 oz glass of hard liquor (44 mL). Do not use any products that contain nicotine or tobacco. These products include cigarettes, chewing tobacco, and vaping devices, such as e-cigarettes. If you need help quitting, ask your health care provider. Activity  Follow a regular exercise program to stay fit. This will help you maintain your balance. Ask your health care provider what types of exercise  are appropriate for you. If you need a cane or walker, use it as recommended by your health care provider. Wear supportive shoes that have nonskid soles. Safety  Remove any tripping hazards, such as rugs, cords, and clutter. Install safety  equipment such as grab bars in bathrooms and safety rails on stairs. Keep rooms and walkways well-lit. General instructions Talk with your health care provider about your risks for falling. Tell your health care provider if: You fall. Be sure to tell your health care provider about all falls, even ones that seem minor. You feel dizzy, tiredness (fatigue), or off-balance. Take over-the-counter and prescription medicines only as told by your health care provider. These include supplements. Eat a healthy diet and maintain a healthy weight. A healthy diet includes low-fat dairy products, low-fat (lean) meats, and fiber from whole grains, beans, and lots of fruits and vegetables. Stay current with your vaccines. Schedule regular health, dental, and eye exams. Summary Having a healthy lifestyle and getting preventive care can help to protect your health and wellness after age 69. Screening and testing are the best way to find a health problem early and help you avoid having a fall. Early diagnosis and treatment give you the best chance for managing medical conditions that are more common for people who are older than age 58. Falls are a major cause of broken bones and head injuries in people who are older than age 21. Take precautions to prevent a fall at home. Work with your health care provider to learn what changes you can make to improve your health and wellness and to prevent falls. This information is not intended to replace advice given to you by your health care provider. Make sure you discuss any questions you have with your health care provider. Document Revised: 06/16/2020 Document Reviewed: 06/16/2020 Elsevier Patient Education  2024 Elsevier Inc.    Edwina Barth, MD [facility]

## 2022-08-09 ENCOUNTER — Ambulatory Visit (HOSPITAL_BASED_OUTPATIENT_CLINIC_OR_DEPARTMENT_OTHER): Payer: Medicare Other | Admitting: Cardiology

## 2022-08-10 ENCOUNTER — Other Ambulatory Visit (HOSPITAL_BASED_OUTPATIENT_CLINIC_OR_DEPARTMENT_OTHER): Payer: Self-pay

## 2022-09-05 NOTE — Progress Notes (Unsigned)
Synopsis: Referred in July 2024 for pulmonary nodules by Georgina Quint, *  Subjective:   PATIENT ID: Collin Barnes GENDER: male DOB: 15-Mar-1940, MRN: [id number]  No chief complaint on file.   This is a 82 year old gentleman past medical history of hypertension, aortic atherosclerosis, hypothyroidism gastroesophageal reflux, BPH, thrombocytopenia, anxiety.  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 Cristal Deer and Dr. Alvy Bimler.  Patient referred after having CT imaging the chest which revealed a pulmonary nodule.  Last CT imaging was in June 2022 completed at [facility].  This revealed a 9 x 5 mm noncalcified nodule in the right lower lobe as well as a 5.7 mm subpleural noncalcified nodule in the right lower lobe.  At the time recommended CT follow-up with do not see that this has been completed also had tiny calcified granuloma in the right middle lobe that was stable.  Lobe pulmonary nodule    ***  No past medical history on file.   No family history on file.   No past surgical history on file.  Social History   Socioeconomic History   Marital status: Married    Spouse name: Not on file   Number of children: Not on file   Years of education: Not on file   Highest education level: Master's degree (e.g., MA, MS, MEng, MEd, MSW, MBA)  Occupational History   Not on file  Tobacco Use   Smoking status: Never   Smokeless tobacco: Never  Substance and Sexual Activity   Alcohol use: Never   Drug use: Never   Sexual activity: Not on file  Other Topics Concern   Not on file  Social History Narrative   Not on file   Social Determinants of Health   Financial Resource Strain: Low Risk  (07/15/2022)   Overall Financial Resource Strain (CARDIA)    Difficulty of Paying Living Expenses: Not hard at all  Food Insecurity: No Food Insecurity (07/15/2022)   Hunger Vital Sign    Worried About Running Out of Food in the Last  Year: Never true    Ran Out of Food in the Last Year: Never true  Transportation Needs: No Transportation Needs (07/15/2022)   PRAPARE - Administrator, Civil Service (Medical): No    Lack of Transportation (Non-Medical): No  Physical Activity: Insufficiently Active (07/15/2022)   Exercise Vital Sign    Days of Exercise per Week: 4 days    Minutes of Exercise per Session: 30 min  Stress: No Stress Concern Present (07/15/2022)   [employer] of Occupational Health - Occupational Stress Questionnaire    Feeling of Stress : Not at all  Social Connections: Unknown (07/15/2022)   Social Connection and Isolation Panel [NHANES]    Frequency of Communication with Friends and Family: Three times a week    Frequency of Social Gatherings with Friends and Family: Three times a week    Attends Religious Services: More than 4 times per year    Active Member of Clubs or Organizations: Not on file    Attends Banker Meetings: Not on file    Marital Status: Married  Intimate Partner Violence: Unknown (05/11/2021)   Received from [facility]   HITS    Physically Hurt: Not on file    Insult or Talk Down To: Not on file    Threaten Physical Harm: Not on file    Scream or Curse: Not on file     No  Known Allergies   Outpatient Medications Prior to Visit  Medication Sig Dispense Refill   Apoaequorin (PREVAGEN PO) Take by mouth.     atenolol (TENORMIN) 25 MG tablet Take 1 tablet (25 mg total) by mouth daily. 90 tablet 3   Biotin 1000 MCG CHEW Chew by mouth.     Calcium Carb-Cholecalciferol (CALCIUM/VITAMIN D PO) Take by mouth.     chlordiazePOXIDE (LIBRIUM) 10 MG capsule Take 1 capsule (10 mg total) by mouth 3 (three) times daily as needed for anxiety. 90 capsule 1   cyanocobalamin (,VITAMIN B-12,) 1000 MCG/ML injection Inject 1,000 mcg into the muscle every 30 (thirty) days.     diphenhydrAMINE (SOMINEX) 25 MG tablet Take 25 mg by mouth at bedtime as needed for sleep.      FERROUS SULFATE PO Take by mouth.     finasteride (PROSCAR) 5 MG tablet Take 1 tablet (5 mg total) by mouth daily. 90 tablet 3   hydrochlorothiazide (HYDRODIURIL) 25 MG tablet Take 1 tablet (25 mg total) by mouth daily. 90 tablet 3   Lactobacillus-Inulin (CULTURELLE DIGESTIVE DAILY PO) Take by mouth.     levothyroxine (SYNTHROID) 75 MCG tablet Take 1 tablet (75 mcg total) by mouth daily before breakfast. 90 tablet 3   Lutein 40 MG CAPS Take by mouth.     Multiple Vitamin (MULTIVITAMIN ADULT PO) Take by mouth.     Omega-3 Fatty Acids (FISH OIL) 1000 MG CAPS Take by mouth.     omeprazole (PRILOSEC) 40 MG capsule Take 1 capsule (40 mg total) by mouth daily. 90 capsule 3   rosuvastatin (CRESTOR) 10 MG tablet Take 1 tablet (10 mg total) by mouth daily. 90 tablet 3   tamsulosin (FLOMAX) 0.4 MG CAPS capsule Take 1 capsule (0.4 mg total) by mouth daily. 90 capsule 3   vitamin C (ASCORBIC ACID) 500 MG tablet Take 500 mg by mouth daily.     No facility-administered medications prior to visit.    ROS   Objective:  Physical Exam   There were no vitals filed for this visit.   on *** LPM *** RA BMI Readings from Last 3 Encounters:  07/19/22 30.57 kg/m  07/16/22 30.51 kg/m  05/06/22 31.78 kg/m   Wt Readings from Last 3 Encounters:  07/19/22 207 lb (93.9 kg)  07/16/22 206 lb 9.6 oz (93.7 kg)  05/06/22 209 lb (94.8 kg)     CBC    Component Value Date/Time   WBC 5.4 07/19/2022 1148   RBC 4.26 07/19/2022 1148   HGB 13.3 07/19/2022 1148   HCT 40.1 07/19/2022 1148   PLT 120.0 (L) 07/19/2022 1148   MCV 94.2 07/19/2022 1148   MCH 31.7 05/24/2021 1208   MCHC 33.0 07/19/2022 1148   RDW 15.3 07/19/2022 1148   LYMPHSABS 1.6 07/19/2022 1148   MONOABS 0.4 07/19/2022 1148   EOSABS 0.0 07/19/2022 1148   BASOSABS 0.1 07/19/2022 1148    ***  Chest Imaging: ***  Pulmonary Functions Testing Results:     No data to display          FeNO: ***  Pathology: ***  Echocardiogram:  ***  Heart Catheterization: ***    Assessment & Plan:   No diagnosis found.  Discussion: ***   Current Outpatient Medications:    Apoaequorin (PREVAGEN PO), Take by mouth., Disp: , Rfl:    atenolol (TENORMIN) 25 MG tablet, Take 1 tablet (25 mg total) by mouth daily., Disp: 90 tablet, Rfl: 3   Biotin 1000 MCG CHEW,  Chew by mouth., Disp: , Rfl:    Calcium Carb-Cholecalciferol (CALCIUM/VITAMIN D PO), Take by mouth., Disp: , Rfl:    chlordiazePOXIDE (LIBRIUM) 10 MG capsule, Take 1 capsule (10 mg total) by mouth 3 (three) times daily as needed for anxiety., Disp: 90 capsule, Rfl: 1   cyanocobalamin (,VITAMIN B-12,) 1000 MCG/ML injection, Inject 1,000 mcg into the muscle every 30 (thirty) days., Disp: , Rfl:    diphenhydrAMINE (SOMINEX) 25 MG tablet, Take 25 mg by mouth at bedtime as needed for sleep., Disp: , Rfl:    FERROUS SULFATE PO, Take by mouth., Disp: , Rfl:    finasteride (PROSCAR) 5 MG tablet, Take 1 tablet (5 mg total) by mouth daily., Disp: 90 tablet, Rfl: 3   hydrochlorothiazide (HYDRODIURIL) 25 MG tablet, Take 1 tablet (25 mg total) by mouth daily., Disp: 90 tablet, Rfl: 3   Lactobacillus-Inulin (CULTURELLE DIGESTIVE DAILY PO), Take by mouth., Disp: , Rfl:    levothyroxine (SYNTHROID) 75 MCG tablet, Take 1 tablet (75 mcg total) by mouth daily before breakfast., Disp: 90 tablet, Rfl: 3   Lutein 40 MG CAPS, Take by mouth., Disp: , Rfl:    Multiple Vitamin (MULTIVITAMIN ADULT PO), Take by mouth., Disp: , Rfl:    Omega-3 Fatty Acids (FISH OIL) 1000 MG CAPS, Take by mouth., Disp: , Rfl:    omeprazole (PRILOSEC) 40 MG capsule, Take 1 capsule (40 mg total) by mouth daily., Disp: 90 capsule, Rfl: 3   rosuvastatin (CRESTOR) 10 MG tablet, Take 1 tablet (10 mg total) by mouth daily., Disp: 90 tablet, Rfl: 3   tamsulosin (FLOMAX) 0.4 MG CAPS capsule, Take 1 capsule (0.4 mg total) by mouth daily., Disp: 90 capsule, Rfl: 3   vitamin C (ASCORBIC ACID) 500 MG tablet, Take 500 mg by mouth  daily., Disp: , Rfl:   I spent *** minutes dedicated to the care of this patient on the date of this encounter to include pre-visit review of records, face-to-face time with the patient discussing conditions above, post visit ordering of testing, clinical documentation with the electronic health record, making appropriate referrals as documented, and communicating necessary findings to members of the patients care team.   Josephine Igo, DO [facility] 09/05/2022 2:38 PM

## 2022-09-06 ENCOUNTER — Ambulatory Visit (INDEPENDENT_AMBULATORY_CARE_PROVIDER_SITE_OTHER): Payer: Medicare Other | Admitting: Pulmonary Disease

## 2022-09-06 ENCOUNTER — Encounter: Payer: Self-pay | Admitting: Pulmonary Disease

## 2022-09-06 VITALS — BP 120/80 | HR 53 | Ht 69.0 in | Wt 208.0 lb

## 2022-09-06 DIAGNOSIS — Z789 Other specified health status: Secondary | ICD-10-CM

## 2022-09-06 DIAGNOSIS — R911 Solitary pulmonary nodule: Secondary | ICD-10-CM | POA: Diagnosis not present

## 2022-09-06 NOTE — Patient Instructions (Signed)
Thank you for visiting Dr. Tonia Brooms at [facility]. Today we recommend the following:  Orders Placed This Encounter  Procedures   CT Chest Wo Contrast   We will let you know the ct results and if there is anything that we need to do afterwards.   Return if symptoms worsen or fail to improve.    Please do your part to reduce the spread of COVID-19.

## 2022-09-15 ENCOUNTER — Ambulatory Visit (HOSPITAL_COMMUNITY): Payer: Medicare Other

## 2022-09-17 ENCOUNTER — Ambulatory Visit (HOSPITAL_COMMUNITY)
Admission: RE | Admit: 2022-09-17 | Discharge: 2022-09-17 | Disposition: A | Discharge status: Home / Self Care | Payer: Medicare Other | Source: Ambulatory Visit | Attending: Pulmonary Disease | Admitting: Pulmonary Disease

## 2022-09-17 DIAGNOSIS — R911 Solitary pulmonary nodule: Secondary | ICD-10-CM

## 2022-09-29 ENCOUNTER — Telehealth: Payer: Self-pay | Admitting: Pulmonary Disease

## 2022-09-29 NOTE — Progress Notes (Signed)
Patient needs repeat ct chest in 12 months. Please have call back for follow up appt with SG to review. Can be virtual if needed.   Thanks,  BLI  Collin Igo, DO [facility] 09/29/2022 4:22 PM

## 2022-09-30 ENCOUNTER — Other Ambulatory Visit: Payer: Self-pay

## 2022-09-30 DIAGNOSIS — R911 Solitary pulmonary nodule: Secondary | ICD-10-CM

## 2022-10-07 NOTE — Telephone Encounter (Signed)
Patient has made an appt to have video visit with Dr Tonia Brooms and states that he will talk to him about it then.

## 2022-10-15 ENCOUNTER — Telehealth (INDEPENDENT_AMBULATORY_CARE_PROVIDER_SITE_OTHER): Payer: Medicare Other | Admitting: Pulmonary Disease

## 2022-10-15 VITALS — Wt 209.0 lb

## 2022-10-15 DIAGNOSIS — R911 Solitary pulmonary nodule: Secondary | ICD-10-CM | POA: Diagnosis not present

## 2022-10-15 NOTE — Progress Notes (Signed)
Virtual Visit via Telephone Note  I connected with Collin Barnes on 10/15/22 at  4:00 PM EDT by telephone and verified that I am speaking with the correct person using two identifiers.  Location: Patient: Home  Provider: Office   I discussed the limitations, risks, security and privacy concerns of performing an evaluation and management service by telephone and the availability of in person appointments. I also discussed with the patient that there may be a patient responsible charge related to this service. The patient expressed understanding and agreed to proceed.   History of Present Illness:  This is a 82 year old gentleman past medical history of hypertension, aortic atherosclerosis, hypothyroidism gastroesophageal reflux, BPH, thrombocytopenia, anxiety. 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 Cristal Deer and Dr. Alvy Bimler. Patient referred after having CT imaging the chest which revealed a pulmonary nodule. Last CT imaging was in June 2022 completed at [facility] system. This revealed a 9 x 5 mm noncalcified nodule in the right lower lobe as well as a 5.7 mm subpleural noncalcified nodule in the right lower lobe. At the time recommended CT follow-up with do not see that this has been completed also had tiny calcified granuloma in the right middle lobe that was stable. Respiratory standpoint has no complaints. Lung nodules have been seen on previous CT imaging in epic care everywhere. This was completed while he was in [place].   OV 10/15/2022: Follow-up CT imaging for evaluation of his previous pulmonary nodules.  They appear stable.  Largest 10 mm in size.  His mother scattered pulmonary nodules appear inflammatory.  CT scan of the chest was completed August 2024.  Reviewed today via phone.   Observations/Objective:  Patient with no issues.  Able to speak in complete sentences but appears in no distress.  CT chest pulmonary  nodule: Nodules are stable.  Recommend continued follow-up. The patient's images have been independently reviewed by me.    Assessment and Plan:  Multiple pulmonary nodules.  Plan: Repeat noncontrast CT chest in 9 months. Will have this done in May. He needs to have it done in May before he goes to [place]. Will set up a virtual visit to discuss CT results after CT is complete.  Follow Up Instructions:    I discussed the assessment and treatment plan with the patient. The patient was provided an opportunity to ask questions and all were answered. The patient agreed with the plan and demonstrated an understanding of the instructions.   The patient was advised to call back or seek an in-person evaluation if the symptoms worsen or if the condition fails to improve as anticipated.  I provided 9 minutes of non-face-to-face time during this encounter.   Josephine Igo, DO

## 2022-12-20 ENCOUNTER — Other Ambulatory Visit: Payer: Self-pay | Admitting: Family Medicine

## 2022-12-20 ENCOUNTER — Telehealth: Payer: Self-pay | Admitting: Family Medicine

## 2022-12-20 DIAGNOSIS — M545 Low back pain, unspecified: Secondary | ICD-10-CM

## 2022-12-20 NOTE — Telephone Encounter (Signed)
Pt would like repeat epidural/Nerve Root.  Last done 02/04/2022 Last OV with Katrinka Blazing 01/26/2022  Pt informed visit might be needed, please confirm as he winters in [place] and has a small gap of time to get the visit and epidural.

## 2022-12-20 NOTE — Telephone Encounter (Signed)
Epidural ordered. Patient informed

## 2023-01-25 NOTE — Discharge Instructions (Signed)

## 2023-01-26 ENCOUNTER — Ambulatory Visit
Admission: RE | Admit: 2023-01-26 | Discharge: 2023-01-26 | Disposition: A | Discharge status: Home / Self Care | Payer: Medicare Other | Source: Ambulatory Visit | Attending: Family Medicine | Admitting: Family Medicine

## 2023-01-26 ENCOUNTER — Other Ambulatory Visit: Payer: Self-pay | Admitting: Family Medicine

## 2023-01-26 ENCOUNTER — Inpatient Hospital Stay
Admission: RE | Admit: 2023-01-26 | Discharge: 2023-01-26 | Disposition: A | Discharge status: Home / Self Care | Payer: Self-pay | Source: Ambulatory Visit | Attending: Family Medicine | Admitting: Family Medicine

## 2023-01-26 DIAGNOSIS — M545 Low back pain, unspecified: Secondary | ICD-10-CM

## 2023-01-26 MED ORDER — IOPAMIDOL (ISOVUE-M 200) INJECTION 41%
1.0000 mL | Freq: Once | INTRAMUSCULAR | Status: AC
  Administered 2023-01-26: 1 mL via EPIDURAL

## 2023-01-26 MED ORDER — METHYLPREDNISOLONE ACETATE 40 MG/ML INJ SUSP (RADIOLOG
80.0000 mg | Freq: Once | INTRAMUSCULAR | Status: AC
  Administered 2023-01-26: 80 mg via EPIDURAL

## 2023-02-03 ENCOUNTER — Other Ambulatory Visit: Payer: Self-pay | Admitting: Emergency Medicine

## 2023-02-03 ENCOUNTER — Other Ambulatory Visit: Payer: Self-pay | Admitting: Nurse Practitioner

## 2023-02-03 DIAGNOSIS — F411 Generalized anxiety disorder: Secondary | ICD-10-CM

## 2023-02-03 MED ORDER — CHLORDIAZEPOXIDE HCL 10 MG PO CAPS: 10.0000 mg | ORAL_CAPSULE | Freq: Three times a day (TID) | ORAL | 0 refills | Status: DC | PRN

## 2023-02-03 NOTE — Telephone Encounter (Signed)
Copied from CRM [id number]. Topic: Clinical - Medication Refill >> Feb 03, 2023  9:05 AM Elizebeth Brooking wrote: Most Recent Primary Care Visit:  Provider: Georgina Quint  Department: [facility] [place]  Visit Type: OFFICE VISIT  Date: 07/19/2022  Medication: [address], [place], [place] [address]  Has the patient contacted their pharmacy? Yes (Agent: If no, request that the patient contact the pharmacy for the refill. If patient does not wish to contact the pharmacy document the reason why and proceed with request.) (Agent: If yes, when and what did the pharmacy advise?)  Is this the correct pharmacy for this prescription? Yes If no, delete pharmacy and type the correct one.  This is the patient's preferred pharmacy:  [facility]   [address] [place], [place] [address]  Has the prescription been filled recently? No  Is the patient out of the medication? Yes  Has the patient been seen for an appointment in the last year OR does the patient have an upcoming appointment? Yes  Can we respond through MyChart? Yes  Agent: Please be advised that Rx refills may take up to 3 business days. We ask that you follow-up with your pharmacy.

## 2023-02-03 NOTE — Telephone Encounter (Signed)
Copied from CRM [id number]. Topic: Clinical - Medication Refill >> Feb 03, 2023  9:05 AM Elizebeth Brooking wrote: Most Recent Primary Care Visit:  Provider: Georgina Quint  Department: [facility] [place]  Visit Type: OFFICE VISIT  Date: 07/19/2022  Medication: [address], [place], [place] [address]  Has the patient contacted their pharmacy? Yes (Agent: If no, request that the patient contact the pharmacy for the refill. If patient does not wish to contact the pharmacy document the reason why and proceed with request.) (Agent: If yes, when and what did the pharmacy advise?)  Is this the correct pharmacy for this prescription? Yes If no, delete pharmacy and type the correct one.  This is the patient's preferred pharmacy:     Has the prescription been filled recently?   Is the patient out of the medication?   Has the patient been seen for an appointment in the last year OR does the patient have an upcoming appointment?   Can we respond through MyChart?   Agent: Please be advised that Rx refills may take up to 3 business days. We ask that you follow-up with your pharmacy.

## 2023-02-03 NOTE — Telephone Encounter (Signed)
Copied from CRM [id number]. Topic: Clinical - Medication Refill >> Feb 03, 2023  9:01 AM Elizebeth Brooking wrote: Most Recent Primary Care Visit:  Provider: Georgina Quint  Department: [facility] [place]  Visit Type: OFFICE VISIT  Date: 07/19/2022  Medication: chlordiazePOXIDE (LIBRIUM) 10 MG capsule  Has the patient contacted their pharmacy? No (Agent: If no, request that the patient contact the pharmacy for the refill. If patient does not wish to contact the pharmacy document the reason why and proceed with request.) (Agent: If yes, when and what did the pharmacy advise?)  Is this the correct pharmacy for this prescription? Yes If no, delete pharmacy and type the correct one.  This is the patient's preferred pharmacy:  [facility]  [address] [place], [place] [address]   Has the prescription been filled recently? No  Is the patient out of the medication? Yes  Has the patient been seen for an appointment in the last year OR does the patient have an upcoming appointment? Yes  Can we respond through MyChart? Yes  Agent: Please be advised that Rx refills may take up to 3 business days. We ask that you follow-up with your pharmacy.

## 2023-02-10 ENCOUNTER — Encounter: Payer: Self-pay | Admitting: Nurse Practitioner

## 2023-02-10 ENCOUNTER — Other Ambulatory Visit: Payer: Self-pay | Admitting: Emergency Medicine

## 2023-02-10 ENCOUNTER — Other Ambulatory Visit: Payer: Self-pay | Admitting: Nurse Practitioner

## 2023-02-10 ENCOUNTER — Other Ambulatory Visit: Payer: Self-pay

## 2023-02-10 DIAGNOSIS — F411 Generalized anxiety disorder: Secondary | ICD-10-CM

## 2023-02-10 MED ORDER — CHLORDIAZEPOXIDE HCL 10 MG PO CAPS: 10.0000 mg | ORAL_CAPSULE | Freq: Three times a day (TID) | ORAL | 0 refills | Status: DC | PRN

## 2023-02-10 NOTE — Progress Notes (Signed)
 This encounter was created in error - please disregard.

## 2023-02-10 NOTE — Telephone Encounter (Signed)
 Copied from CRM [id number]. Topic: Clinical - Medication Refill >> Feb 10, 2023 11:19 AM [name] wrote: Most Recent Primary Care Visit:  Provider: PURCELL EMIL SCHANZ  Department: [facility] [place]  Visit Type: OFFICE VISIT  Date: 07/19/2022  Medication: chlordiazePOXIDE  (LIBRIUM ) 10 MG capsule  Has the patient contacted their pharmacy? Yes (Agent: If no, request that the patient contact the pharmacy for the refill. If patient does not wish to contact the pharmacy document the reason why and proceed with request.) (Agent: If yes, when and what did the pharmacy advise?)  Is this the correct pharmacy for this prescription? Yes If no, delete pharmacy and type the correct one.  This is the patient's preferred pharmacy:   [facility]  [address] [place], [place] [address]    Has the prescription been filled recently? Yes  Is the patient out of the medication? Yes  Has the patient been seen for an appointment in the last year OR does the patient have an upcoming appointment? Yes  Can we respond through MyChart? Yes  Agent: Please be advised that Rx refills may take up to 3 business days. We ask that you follow-up with your pharmacy.

## 2023-02-10 NOTE — Telephone Encounter (Signed)
 Copied from CRM [id number]. Topic: Clinical - Medication Refill >> Feb 10, 2023 11:19 AM [name] wrote: Most Recent Primary Care Visit:  Provider: PURCELL EMIL SCHANZ  Department: [facility] [place]  Visit Type: OFFICE VISIT  Date: 07/19/2022  Medication: chlordiazePOXIDE  (LIBRIUM ) 10 MG capsule  Has the patient contacted their pharmacy? Yes (Agent: If no, request that the patient contact the pharmacy for the refill. If patient does not wish to contact the pharmacy document the reason why and proceed with request.) (Agent: If yes, when and what did the pharmacy advise?)  Is this the correct pharmacy for this prescription? Yes If no, delete pharmacy and type the correct one.  This is the patient's preferred pharmacy:  [id number] - [place], [place] - [address] AT [facility] OF [address] [place] [address] Phone: [phone] Fax: [phone]  [facility] [place] [facility] [address] [place] [address] Phone: [phone] Fax: [phone]  [facility] [phone] - [place], [place] - [address] AT [facility] [address] [place] [address] Phone: [phone] Fax: [phone]   Has the prescription been filled recently?   Is the patient out of the medication?   Has the patient been seen for an appointment in the last year OR does the patient have an upcoming appointment?   Can we respond through MyChart?   Agent: Please be advised that Rx refills may take up to 3 business days. We ask that you follow-up with your pharmacy.

## 2023-04-07 ENCOUNTER — Telehealth: Payer: Self-pay

## 2023-04-07 NOTE — Telephone Encounter (Unsigned)
 Copied from CRM [id number]. Topic: Appointments - Transfer of Care >> Apr 07, 2023 12:52 PM Truddie Crumble wrote: Pt is requesting to transfer FROM: Collin Barnes  Pt is requesting to transfer TO: Collin Barnes Reason for requested transfer: patient is a graduate of [employer] and the dean of the school of medicine is a personal friend It is the responsibility of the team the patient would like to transfer to (Dr. Oliver Barnes) to reach out to the patient if for any reason this transfer is not acceptable.

## 2023-04-08 ENCOUNTER — Other Ambulatory Visit: Payer: Self-pay | Admitting: Emergency Medicine

## 2023-04-08 DIAGNOSIS — F411 Generalized anxiety disorder: Secondary | ICD-10-CM

## 2023-04-08 NOTE — Telephone Encounter (Signed)
 Patient would also like for Dr. Jonny Ruiz to know that his daughter-in-law is the "chief gynecologist" at [facility] and she recommends that Dr. Jonny Ruiz take him as a patient.

## 2023-04-08 NOTE — Telephone Encounter (Signed)
 Sorry, I believe pt is already established with PCP Dr Alvy Bimler who is excellent, so I would think that he really needs to change for now.    thanks

## 2023-04-08 NOTE — Telephone Encounter (Unsigned)
 Copied from CRM [id number]. Topic: Clinical - Medication Refill >> Apr 08, 2023  3:15 PM Alcus Dad wrote: Most Recent Primary Care Visit:  Provider: Georgina Quint  Department: [facility] [place]  Visit Type: OFFICE VISIT  Date: 07/19/2022  Medication: chlordiazePOXIDE (LIBRIUM) 10 MG capsule  Has the patient contacted their pharmacy? No  (Agent: If no, request that the patient contact the pharmacy for the refill. If patient does not wish to contact the pharmacy document the reason why and proceed with request.) (Agent: If yes, when and what did the pharmacy advise?)  Is this the correct pharmacy for this prescription? Yes If no, delete pharmacy and type the correct one.  This is the patient's preferred pharmacy:   [id number] - [place], [place] - [address] AT [facility] [address] [place] [address] Phone: [phone] Fax: [phone]  Has the prescription been filled recently? No  Is the patient out of the medication? No  Has the patient been seen for an appointment in the last year OR does the patient have an upcoming appointment? Yes  Can we respond through MyChart? Yes  Agent: Please be advised that Rx refills may take up to 3 business days. We ask that you follow-up with your pharmacy.

## 2023-04-08 NOTE — Telephone Encounter (Signed)
 It is up to the patient if he wants to change physicians.  Whatever he wants to do I am okay with it.

## 2023-04-22 ENCOUNTER — Other Ambulatory Visit: Payer: Self-pay | Admitting: Emergency Medicine

## 2023-04-22 NOTE — Telephone Encounter (Signed)
 Copied from CRM [id number]. Topic: Clinical - Medication Refill >> Apr 22, 2023  4:02 PM Pascal Lux wrote: Most Recent Primary Care Visit:  Provider: Georgina Quint  Department: [facility] [place]  Visit Type: OFFICE VISIT  Date: 07/19/2022  Medication: hydrochlorothiazide (HYDRODIURIL) 25 MG tablet [[id number]]  Has the patient contacted their pharmacy? Yes (Agent: If no, request that the patient contact the pharmacy for the refill. If patient does not wish to contact the pharmacy document the reason why and proceed with request.) (Agent: If yes, when and what did the pharmacy advise?) Call provider  Is this the correct pharmacy for this prescription? Yes If no, delete pharmacy and type the correct one.  This is the patient's preferred pharmacy:  [id number] - [place], [place] - [address] [facility] [address] [place] [address] Phone: [phone] Fax: [phone]   Has the prescription been filled recently? No  Is the patient out of the medication? Yes  Has the patient been seen for an appointment in the last year OR does the patient have an upcoming appointment? Yes  Can we respond through MyChart? Yes  Agent: Please be advised that Rx refills may take up to 3 business days. We ask that you follow-up with your pharmacy.

## 2023-04-26 ENCOUNTER — Other Ambulatory Visit: Payer: Self-pay | Admitting: Radiology

## 2023-04-26 ENCOUNTER — Telehealth: Payer: Self-pay

## 2023-04-26 MED ORDER — HYDROCHLOROTHIAZIDE 25 MG PO TABS: 25.0000 mg | ORAL_TABLET | Freq: Every day | ORAL | 3 refills | Status: DC

## 2023-04-26 NOTE — Telephone Encounter (Signed)
 Copied from CRM [id number]. Topic: Clinical - Prescription Issue >> Apr 26, 2023  1:18 PM [place] wrote: Reason for CRM: patient called on the 14th to have a medication of hydrochlorothiazide (HYDRODIURIL) 25 MG tablet [[id number]] sent to a pharmacy in [facility] for a refill. It has not been sent in yet. Please send in and reach out to patient as soon as possible. [phone]

## 2023-04-26 NOTE — Telephone Encounter (Signed)
 Spoke with patient and refill request was sent and physical was scheduled

## 2023-05-05 ENCOUNTER — Other Ambulatory Visit: Payer: Self-pay | Admitting: Radiology

## 2023-05-05 ENCOUNTER — Other Ambulatory Visit: Payer: Self-pay

## 2023-05-05 ENCOUNTER — Telehealth: Payer: Self-pay | Admitting: Emergency Medicine

## 2023-05-05 ENCOUNTER — Telehealth: Payer: Self-pay | Admitting: Radiology

## 2023-05-05 DIAGNOSIS — F411 Generalized anxiety disorder: Secondary | ICD-10-CM

## 2023-05-05 MED ORDER — HYDROCHLOROTHIAZIDE 25 MG PO TABS: 25.0000 mg | ORAL_TABLET | Freq: Every day | ORAL | 3 refills | Status: DC

## 2023-05-05 NOTE — Telephone Encounter (Signed)
 Copied from CRM [id number]. Topic: Clinical - Medication Question >> May 05, 2023  8:56 AM Saverio Danker wrote: Reason for CRM:  calling in to have a medication of hydrochlorothiazide (HYDRODIURIL) 25 MG tablet [[id number]] sent to a pharmacy in [facility] for a refill. It has not been sent in yet. Please send in and reach out to patient as soon as possible. [phone]  Stated they have been waiting and have not heard anything yet.

## 2023-05-05 NOTE — Telephone Encounter (Signed)
 Copied from CRM [id number]. Topic: Clinical - Medication Question >> May 05, 2023  8:56 AM Saverio Danker wrote: Reason for CRM:  calling in to have a medication of hydrochlorothiazide (HYDRODIURIL) 25 MG tablet [[id number]] sent to a pharmacy in [facility] for a refill. It has not been sent in yet. Please send in and reach out to patient as soon as possible. [phone]  Stated they have been waiting and have not heard anything yet. >> May 05, 2023 11:11 AM Denese Killings wrote: [facility] [place] [facility] for chlordiazePOXIDE (LIBRIUM) 10 MG capsule is [phone]. Patient wife wants to pass this to Dr. Latrelle Dodrill nurse.

## 2023-05-05 NOTE — Progress Notes (Signed)
 Reordered patients hydrochlorothiazide script, so it will send to the pharmacy in [place] as he is requesting correctly. Attempted to reach pharmacy to confirm it was received, however they continue disconnecting the call. Per epic, pharmacy confirmed script was received at 2:23pm as of today 05/05/2023. Called patients wife and made her aware of this.

## 2023-05-05 NOTE — Telephone Encounter (Signed)
 [facility] [place] [place] in regards of pt hydrochlorothiazide. Wanting to confirm if [place] went through. Patient has been call for this medication

## 2023-05-06 MED ORDER — CHLORDIAZEPOXIDE HCL 10 MG PO CAPS: 10.0000 mg | ORAL_CAPSULE | Freq: Three times a day (TID) | ORAL | 0 refills | Status: AC | PRN

## 2023-05-06 NOTE — Addendum Note (Signed)
 Addended by: Pincus Sanes on: 05/06/2023 04:42 PM   Modules accepted: Orders

## 2023-05-06 NOTE — Telephone Encounter (Signed)
 Copied from CRM [id number]. Topic: Clinical - Prescription Issue >> May 06, 2023  2:59 PM Elizebeth Brooking wrote: Reason for CRM: Patient Wife called in regarding medication  chlordiazePOXIDE (LIBRIUM) 10 MG capsule , stated she went to the pharmacy and it is still not there, stated she is wanting the 60 tablets [facility]  Confirmed that is is sent by an escript , Would like for a nurse to give her a callback regarding this

## 2023-05-06 NOTE — Telephone Encounter (Signed)
 Prescription sent

## 2023-06-22 ENCOUNTER — Ambulatory Visit (HOSPITAL_BASED_OUTPATIENT_CLINIC_OR_DEPARTMENT_OTHER)
Admission: RE | Admit: 2023-06-22 | Discharge: 2023-06-22 | Disposition: A | Discharge status: Home / Self Care | Source: Ambulatory Visit | Attending: Pulmonary Disease | Admitting: Pulmonary Disease

## 2023-06-22 DIAGNOSIS — R911 Solitary pulmonary nodule: Secondary | ICD-10-CM | POA: Insufficient documentation

## 2023-06-28 ENCOUNTER — Ambulatory Visit: Payer: Self-pay | Admitting: Pulmonary Disease

## 2023-07-07 NOTE — Telephone Encounter (Signed)
-----   Message from Praveen Mannam sent at 06/28/2023  8:08 AM EDT ----- CT shows stable lung nodule.  Follow-up as scheduled with Isa Manuel gross

## 2023-07-07 NOTE — Telephone Encounter (Signed)
Dr. Mannam, please advise. Thanks!  

## 2023-07-11 ENCOUNTER — Encounter: Payer: Self-pay | Admitting: Emergency Medicine

## 2023-07-11 ENCOUNTER — Ambulatory Visit (INDEPENDENT_AMBULATORY_CARE_PROVIDER_SITE_OTHER): Payer: PRIVATE HEALTH INSURANCE | Admitting: Emergency Medicine

## 2023-07-11 ENCOUNTER — Ambulatory Visit: Payer: PRIVATE HEALTH INSURANCE | Admitting: Internal Medicine

## 2023-07-11 ENCOUNTER — Ambulatory Visit: Payer: Self-pay | Admitting: Emergency Medicine

## 2023-07-11 ENCOUNTER — Other Ambulatory Visit: Payer: Self-pay | Admitting: Radiology

## 2023-07-11 ENCOUNTER — Ambulatory Visit: Payer: PRIVATE HEALTH INSURANCE | Admitting: Emergency Medicine

## 2023-07-11 VITALS — BP 106/82 | HR 48 | Temp 97.7°F | Ht 69.0 in | Wt 208.0 lb

## 2023-07-11 DIAGNOSIS — R911 Solitary pulmonary nodule: Secondary | ICD-10-CM

## 2023-07-11 DIAGNOSIS — D696 Thrombocytopenia, unspecified: Secondary | ICD-10-CM

## 2023-07-11 DIAGNOSIS — I10 Essential (primary) hypertension: Secondary | ICD-10-CM | POA: Diagnosis not present

## 2023-07-11 DIAGNOSIS — E785 Hyperlipidemia, unspecified: Secondary | ICD-10-CM | POA: Diagnosis not present

## 2023-07-11 DIAGNOSIS — K219 Gastro-esophageal reflux disease without esophagitis: Secondary | ICD-10-CM

## 2023-07-11 DIAGNOSIS — N138 Other obstructive and reflux uropathy: Secondary | ICD-10-CM

## 2023-07-11 DIAGNOSIS — E7800 Pure hypercholesterolemia, unspecified: Secondary | ICD-10-CM

## 2023-07-11 DIAGNOSIS — I700 Atherosclerosis of aorta: Secondary | ICD-10-CM

## 2023-07-11 DIAGNOSIS — F411 Generalized anxiety disorder: Secondary | ICD-10-CM

## 2023-07-11 DIAGNOSIS — I351 Nonrheumatic aortic (valve) insufficiency: Secondary | ICD-10-CM

## 2023-07-11 DIAGNOSIS — E039 Hypothyroidism, unspecified: Secondary | ICD-10-CM

## 2023-07-11 DIAGNOSIS — I2510 Atherosclerotic heart disease of native coronary artery without angina pectoris: Secondary | ICD-10-CM

## 2023-07-11 DIAGNOSIS — N401 Enlarged prostate with lower urinary tract symptoms: Secondary | ICD-10-CM

## 2023-07-11 LAB — LIPID PANEL
Cholesterol: 117 mg/dL (ref 0–200)
HDL: 58.3 mg/dL (ref >39.00)
LDL Cholesterol: 40 mg/dL (ref 0–99)
NonHDL: 58.72
Total CHOL/HDL Ratio: 2
Triglycerides: 94 mg/dL (ref 0.0–149.0)
VLDL: 18.8 mg/dL (ref 0.0–40.0)

## 2023-07-11 LAB — PSA: PSA: 0.11 ng/mL (ref 0.10–4.00)

## 2023-07-11 MED ORDER — LEVOTHYROXINE SODIUM 75 MCG PO TABS: 75.0000 ug | ORAL_TABLET | Freq: Every day | ORAL | 3 refills | Status: AC

## 2023-07-11 MED ORDER — TAMSULOSIN HCL 0.4 MG PO CAPS: 0.4000 mg | ORAL_CAPSULE | Freq: Two times a day (BID) | ORAL | 3 refills | Status: AC

## 2023-07-11 MED ORDER — HYDROCHLOROTHIAZIDE 12.5 MG PO TABS: 12.5000 mg | ORAL_TABLET | Freq: Every day | ORAL | 3 refills | Status: AC

## 2023-07-11 MED ORDER — FINASTERIDE 5 MG PO TABS: 5.0000 mg | ORAL_TABLET | Freq: Every day | ORAL | 3 refills | Status: AC

## 2023-07-11 MED ORDER — SERTRALINE HCL 50 MG PO TABS: 50.0000 mg | ORAL_TABLET | Freq: Every day | ORAL | 3 refills | Status: DC

## 2023-07-11 MED ORDER — OMEPRAZOLE 40 MG PO CPDR: 40.0000 mg | DELAYED_RELEASE_CAPSULE | Freq: Every day | ORAL | 3 refills | Status: AC

## 2023-07-11 MED ORDER — ROSUVASTATIN CALCIUM 10 MG PO TABS: 10.0000 mg | ORAL_TABLET | Freq: Every day | ORAL | 3 refills | Status: AC

## 2023-07-11 MED ORDER — ATENOLOL 25 MG PO TABS
25.0000 mg | ORAL_TABLET | Freq: Every day | ORAL | 3 refills | Status: DC
Start: 2023-07-11 — End: 2023-11-25

## 2023-07-11 NOTE — Assessment & Plan Note (Signed)
 BP Readings from Last 3 Encounters:  07/11/23 106/82  01/26/23 (!) 171/79  09/06/22 120/80  Low blood pressure readings at home as well Continue atenolol  25 mg daily.  Dose was recently decreased by cardiologist.  Recommend to decrease hydrochlorothiazide  to 12.5 mg daily Exercises regularly Cardiovascular risks associated with hypertension discussed Dietary approaches to stop hypertension discussed

## 2023-07-11 NOTE — Assessment & Plan Note (Signed)
 No clinical bleeding episodes. States he follows up with hematologist in [place]  Clinically stable. Most recent platelet count 121,000.

## 2023-07-11 NOTE — Patient Instructions (Signed)
 Health Maintenance After Age 84 After age 4, you are at a higher risk for certain long-term diseases and infections as well as injuries from falls. Falls are a major cause of broken bones and head injuries in people who are older than age 47. Getting regular preventive care can help to keep you healthy and well. Preventive care includes getting regular testing and making lifestyle changes as recommended by your health care provider. Talk with your health care provider about: Which screenings and tests you should have. A screening is a test that checks for a disease when you have no symptoms. A diet and exercise plan that is right for you. What should I know about screenings and tests to prevent falls? Screening and testing are the best ways to find a health problem early. Early diagnosis and treatment give you the best chance of managing medical conditions that are common after age 37. Certain conditions and lifestyle choices may make you more likely to have a fall. Your health care provider may recommend: Regular vision checks. Poor vision and conditions such as cataracts can make you more likely to have a fall. If you wear glasses, make sure to get your prescription updated if your vision changes. Medicine review. Work with your health care provider to regularly review all of the medicines you are taking, including over-the-counter medicines. Ask your health care provider about any side effects that may make you more likely to have a fall. Tell your health care provider if any medicines that you take make you feel dizzy or sleepy. Strength and balance checks. Your health care provider may recommend certain tests to check your strength and balance while standing, walking, or changing positions. Foot health exam. Foot pain and numbness, as well as not wearing proper footwear, can make you more likely to have a fall. Screenings, including: Osteoporosis screening. Osteoporosis is a condition that causes  the bones to get weaker and break more easily. Blood pressure screening. Blood pressure changes and medicines to control blood pressure can make you feel dizzy. Depression screening. You may be more likely to have a fall if you have a fear of falling, feel depressed, or feel unable to do activities that you used to do. Alcohol use screening. Using too much alcohol can affect your balance and may make you more likely to have a fall. Follow these instructions at home: Lifestyle Do not drink alcohol if: Your health care provider tells you not to drink. If you drink alcohol: Limit how much you have to: 0-1 drink a day for women. 0-2 drinks a day for men. Know how much alcohol is in your drink. In the [place] one drink equals one 12 oz bottle of beer (355 mL), one 5 oz glass of wine (148 mL), or one 1 oz glass of hard liquor (44 mL). Do not use any products that contain nicotine or tobacco. These products include cigarettes, chewing tobacco, and vaping devices, such as e-cigarettes. If you need help quitting, ask your health care provider. Activity  Follow a regular exercise program to stay fit. This will help you maintain your balance. Ask your health care provider what types of exercise are appropriate for you. If you need a cane or walker, use it as recommended by your health care provider. Wear supportive shoes that have nonskid soles. Safety  Remove any tripping hazards, such as rugs, cords, and clutter. Install safety equipment such as grab bars in bathrooms and safety rails on stairs. Keep rooms and walkways  well-lit. General instructions Talk with your health care provider about your risks for falling. Tell your health care provider if: You fall. Be sure to tell your health care provider about all falls, even ones that seem minor. You feel dizzy, tiredness (fatigue), or off-balance. Take over-the-counter and prescription medicines only as told by your health care provider. These include  supplements. Eat a healthy diet and maintain a healthy weight. A healthy diet includes low-fat dairy products, low-fat (lean) meats, and fiber from whole grains, beans, and lots of fruits and vegetables. Stay current with your vaccines. Schedule regular health, dental, and eye exams. Summary Having a healthy lifestyle and getting preventive care can help to protect your health and wellness after age 11. Screening and testing are the best way to find a health problem early and help you avoid having a fall. Early diagnosis and treatment give you the best chance for managing medical conditions that are more common for people who are older than age 28. Falls are a major cause of broken bones and head injuries in people who are older than age 48. Take precautions to prevent a fall at home. Work with your health care provider to learn what changes you can make to improve your health and wellness and to prevent falls. This information is not intended to replace advice given to you by your health care provider. Make sure you discuss any questions you have with your health care provider. Document Revised: 06/16/2020 Document Reviewed: 06/16/2020 Elsevier Patient Education  2024 [employer].

## 2023-07-11 NOTE — Assessment & Plan Note (Signed)
 Clinically stable.  Diet and nutrition discussed. Continue rosuvastatin  10 mg daily

## 2023-07-11 NOTE — Assessment & Plan Note (Signed)
 Diet and nutrition discussed. Continue rosuvastatin 10 mg daily.

## 2023-07-11 NOTE — Assessment & Plan Note (Signed)
 Moderate aortic regurgitation. Stable as per recent echocardiogram.  Plan is to recheck echo every 2 to 3 years as per cardiologist.

## 2023-07-11 NOTE — Assessment & Plan Note (Signed)
 States he has been on Librium  on and off 10 mg daily Has chronic anxiety Advised this is not standard of care for chronic anxiety Recommend to start sertraline 50 mg daily

## 2023-07-11 NOTE — Assessment & Plan Note (Signed)
 Clinically euthyroid. Lab Results  Component Value Date   TSH 2.49 07/19/2022    Continue Synthroid  75 mcg daily

## 2023-07-11 NOTE — Progress Notes (Signed)
 Collin Barnes 83 y.o.   Chief Complaint  Patient presents with   Annual Exam    Patient here for physical. Patient wants to go over his Lung CT, Echo cardiogram, labs, and his medications. Patient would like all his meds written scripts. Patient  would like chlordiazePOXIDE  (LIBRIUM ) 10 MG capsule clarify on this script     HISTORY OF PRESENT ILLNESS: This is a 83 y.o. male here for follow-up of multiple medical conditions Overall doing well. History of low platelets.  Last platelet count 121,000.  Sees hematologist down in [place]  where he resides 6 months of the year. History of mild anemia.  On regular vitamin B12 shots History of hypertension.  Low readings.  Hydrochlorothiazide  25 mg daily. History of prostate enlargement.  On Flomax  and finasteride  History of chronic anxiety.  States he has been on chlordiazepoxide  for the last 50 years.  Being prescribed by a doctor in [place] . Recent echocardiogram looked pretty good with ejection fraction of 65.  No significant findings CT chest 06/22/2023.  10 mm unchanged nodule right lower lobe. No other complaints or medical concerns today.  HPI   Prior to Admission medications   Medication Sig Start Date End Date Taking? Authorizing Provider  Apoaequorin (PREVAGEN PO) Take by mouth.   Yes [provider]  atenolol  (TENORMIN ) 25 MG tablet Take 1 tablet (25 mg total) by mouth daily. 07/16/22 07/11/23 Yes Collin Donning, MD  Biotin 1000 MCG CHEW Chew by mouth.   Yes [provider]  Calcium  Carb-Cholecalciferol (CALCIUM /VITAMIN D PO) Take by mouth.   Yes [provider]  chlordiazePOXIDE  (LIBRIUM ) 10 MG capsule Take 1 capsule (10 mg total) by mouth 3 (three) times daily as needed for anxiety. 05/06/23  Yes Collin Barnes, Collin Bourgeois, MD  cyanocobalamin (,VITAMIN B-12,) 1000 MCG/ML injection Inject 1,000 mcg into the muscle every 30 (thirty) days. 05/08/21  Yes [provider]  diphenhydrAMINE  (SOMINEX) 25 MG  tablet Take 25 mg by mouth at bedtime as needed for sleep.   Yes [provider]  FERROUS SULFATE PO Take by mouth.   Yes [provider]  finasteride  (PROSCAR ) 5 MG tablet Take 1 tablet (5 mg total) by mouth daily. 07/19/22  Yes Collin Barnes, Collin Margo, MD  hydrochlorothiazide  (HYDRODIURIL ) 25 MG tablet Take 1 tablet (25 mg total) by mouth daily. 05/05/23  Yes Collin Barnes, Collin Margo, MD  Lactobacillus-Inulin (CULTURELLE DIGESTIVE DAILY PO) Take by mouth.   Yes [provider]  levothyroxine  (SYNTHROID ) 75 MCG tablet Take 1 tablet (75 mcg total) by mouth daily before breakfast. 07/19/22  Yes Collin Barnes, Collin Margo, MD  Lutein 40 MG CAPS Take by mouth.   Yes [provider]  Multiple Vitamin (MULTIVITAMIN ADULT PO) Take by mouth.   Yes [provider]  Omega-3 Fatty Acids (FISH OIL) 1000 MG CAPS Take by mouth.   Yes [provider]  omeprazole  (PRILOSEC) 40 MG capsule Take 1 capsule (40 mg total) by mouth daily. 07/19/22  Yes Collin Barnes, Collin Margo, MD  rosuvastatin  (CRESTOR ) 10 MG tablet Take 1 tablet (10 mg total) by mouth daily. 07/16/22  Yes Collin Donning, MD  tamsulosin  (FLOMAX ) 0.4 MG CAPS capsule Take 1 capsule (0.4 mg total) by mouth daily. 07/19/22  Yes Collin Barnes, Collin Margo, MD  vitamin C (ASCORBIC ACID) 500 MG tablet Take 500 mg by mouth daily.   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/2021   Essential hypertension 07/29/2020   Dyslipidemia  07/29/2020   Lung nodule 11/07/2019   GERD (gastroesophageal reflux disease) 09/15/2019   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   Moderate aortic regurgitation 08/13/2018   Thrombocytopenia (HCC) 07/18/2017   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    Past Medical History:  Diagnosis Date   HTN (hypertension)    Hypothyroid     Past Surgical History:  Procedure Laterality Date   THYROIDECTOMY, PARTIAL      Social History   Socioeconomic History   Marital status: Married    Spouse name: Not on file   Number of children: Not on file   Years of education: Not on file   Highest education level: Master's degree (e.g., MA, MS, MEng, MEd, MSW, MBA)  Occupational History   Not on file  Tobacco Use   Smoking status: Never   Smokeless tobacco: Never  Substance and Sexual Activity   Alcohol use: Never   Drug use: Never   Sexual activity: Not on file  Other Topics Concern   Not on file  Social History Narrative   Not on file   Social Drivers of Health   Financial Resource Strain: Low Risk  (07/07/2023)   Overall Financial Resource Strain (CARDIA)    Difficulty of Paying Living Expenses: Not hard at all  Food Insecurity: No Food Insecurity (07/07/2023)   Hunger Vital Sign    Worried About Running Out of Food in the Last Year: Never true    Ran Out of Food in the Last Year: Never true  Transportation Needs: No Transportation Needs (07/07/2023)   PRAPARE - Administrator, Civil Service (Medical): No    Lack of Transportation (Non-Medical): No  Physical Activity: Sufficiently Active (07/07/2023)   Exercise Vital Sign    Days of Exercise per Week: 4 days    Minutes of Exercise per Session: 40 min  Stress: No Stress Concern Present (07/07/2023)   [employer] of Occupational Health - Occupational Stress Questionnaire    Feeling of Stress : Not at all  Social Connections: Socially Integrated (07/07/2023)   Social Connection and Isolation Panel [NHANES]    Frequency of  Communication with Friends and Family: More than three times a week    Frequency of Social Gatherings with Friends and Family: Three times a week    Attends Religious Services: More than 4 times per year    Active Member of Clubs or Organizations: Yes    Attends Banker Meetings: Not on file    Marital Status: Married  Intimate Partner Violence: Unknown (05/11/2021)   Received from [employer], [facility]   HITS    Physically Hurt: Not on file    Insult or Talk Down To: Not on file    Threaten Physical Harm: Not on file    Scream or Curse: Not on file    Family History  Problem Relation Age of Onset   Lung cancer Neg Hx      Review of Systems  Constitutional: Negative.  Negative for chills and fever.  HENT: Negative.  Negative for congestion and sore throat.   Respiratory: Negative.  Negative  for cough and shortness of breath.   Cardiovascular: Negative.  Negative for chest pain and palpitations.  Gastrointestinal:  Negative for abdominal pain, diarrhea, nausea and vomiting.  Genitourinary: Negative.  Negative for dysuria and hematuria.  Skin: Negative.  Negative for rash.  Neurological:  Negative for dizziness and headaches.  All other systems reviewed and are negative.   Vitals:   07/11/23 0809  BP: 106/82  Pulse: (!) 48  Temp: 97.7 F (36.5 C)  SpO2: 97%    Physical Exam Vitals reviewed.  Constitutional:      Appearance: Normal appearance.  HENT:     Head: Normocephalic.  Eyes:     Extraocular Movements: Extraocular movements intact.  Cardiovascular:     Rate and Rhythm: Normal rate.  Pulmonary:     Effort: Pulmonary effort is normal.  Skin:    General: Skin is warm and dry.  Neurological:     Mental Status: He is alert and oriented to person, place, and time.  Psychiatric:        Mood and Affect: Mood normal.        Behavior: Behavior normal.      ASSESSMENT & PLAN: A total of 44 minutes was spent with the patient and  counseling/coordination of care regarding preparing for this visit, review of most recent office visit notes, review of multiple chronic medical conditions and their management, review of all medications, review of most recent bloodwork results, review of health maintenance items, education on nutrition, prognosis, documentation, and need for follow up.   Problem List Items Addressed This Visit       Cardiovascular and Mediastinum   Nonrheumatic aortic valve insufficiency   Moderate aortic regurgitation. Stable as per recent echocardiogram.  Plan is to recheck echo every 2 to 3 years as per cardiologist.       Relevant Medications   hydrochlorothiazide  (HYDRODIURIL ) 12.5 MG tablet   Coronary artery calcification seen on CT scan   Nonobstructive coronary artery disease No aspirin given chronic anemia and thrombocytopenia and history of GI bleed      Relevant Medications   hydrochlorothiazide  (HYDRODIURIL ) 12.5 MG tablet   Aortic atherosclerosis (HCC)   Diet and nutrition discussed Continue rosuvastatin  10 mg daily      Relevant Medications   hydrochlorothiazide  (HYDRODIURIL ) 12.5 MG tablet   Essential hypertension - Primary   BP Readings from Last 3 Encounters:  07/11/23 106/82  01/26/23 (!) 171/79  09/06/22 120/80  Low blood pressure readings at home as well Continue atenolol  25 mg daily.  Dose was recently decreased by cardiologist.  Recommend to decrease hydrochlorothiazide  to 12.5 mg daily Exercises regularly Cardiovascular risks associated with hypertension discussed Dietary approaches to stop hypertension discussed       Relevant Medications   hydrochlorothiazide  (HYDRODIURIL ) 12.5 MG tablet     Respiratory   Lung nodule   Stable and unchanged 10 mm nodule right lower lobe Seen on recent CT chest May 2025        Digestive   GERD (gastroesophageal reflux disease)   Stable and asymptomatic.  No concerns. Continues omeprazole  40 mg daily as needed         Endocrine   Hypothyroidism   Clinically euthyroid. Lab Results  Component Value Date   TSH 2.49 07/19/2022    Continue Synthroid  75 mcg daily        Genitourinary   BPH with urinary obstruction   Stable.  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 Continue tamsulosin  0.4 mg daily Still  complaining of poor flow. Also taking finasteride  5 mg daily Recommend to increase tamsulosin  to 0.8 daily      Relevant Medications   tamsulosin  (FLOMAX ) 0.4 MG CAPS capsule   Other Relevant Orders   PSA   Tissue transglutaminase, IgA     Hematopoietic and Hemostatic   Thrombocytopenia (HCC)   No clinical bleeding episodes. States he follows up with hematologist in [place]  Clinically stable. Most recent platelet count 121,000.        Other   Anxiety state   States he has been on Librium  on and off 10 mg daily Has chronic anxiety Advised this is not standard of care for chronic anxiety Recommend to start sertraline 50 mg daily      Relevant Medications   sertraline (ZOLOFT) 50 MG tablet   Dyslipidemia   Clinically stable.  Diet and nutrition discussed. Continue rosuvastatin  10 mg daily      Relevant Orders   Lipid panel   Tissue transglutaminase, IgA   Patient Instructions  Health Maintenance After Age 65 After age 82, you are at a higher risk for certain long-term diseases and infections as well as injuries from falls. Falls are a major cause of broken bones and head injuries in people who are older than age 67. Getting regular preventive care can help to keep you healthy and well. Preventive care includes getting regular testing and making lifestyle changes as recommended by your health care provider. Talk with your health care provider about: Which screenings and tests you should have. A screening is a test that checks for a disease when you have no symptoms. A diet and exercise plan that is right for you. What should I know about screenings and tests to prevent  falls? Screening and testing are the best ways to find a health problem early. Early diagnosis and treatment give you the best chance of managing medical conditions that are common after age 48. Certain conditions and lifestyle choices may make you more likely to have a fall. Your health care provider may recommend: Regular vision checks. Poor vision and conditions such as cataracts can make you more likely to have a fall. If you wear glasses, make sure to get your prescription updated if your vision changes. Medicine review. Work with your health care provider to regularly review all of the medicines you are taking, including over-the-counter medicines. Ask your health care provider about any side effects that may make you more likely to have a fall. Tell your health care provider if any medicines that you take make you feel dizzy or sleepy. Strength and balance checks. Your health care provider may recommend certain tests to check your strength and balance while standing, walking, or changing positions. Foot health exam. Foot pain and numbness, as well as not wearing proper footwear, can make you more likely to have a fall. Screenings, including: Osteoporosis screening. Osteoporosis is a condition that causes the bones to get weaker and break more easily. Blood pressure screening. Blood pressure changes and medicines to control blood pressure can make you feel dizzy. Depression screening. You may be more likely to have a fall if you have a fear of falling, feel depressed, or feel unable to do activities that you used to do. Alcohol use screening. Using too much alcohol can affect your balance and may make you more likely to have a fall. Follow these instructions at home: Lifestyle Do not drink alcohol if: Your health care provider tells you not to drink. If you  drink alcohol: Limit how much you have to: 0-1 drink a day for women. 0-2 drinks a day for men. Know how much alcohol is in your drink.  In the [place] one drink equals one 12 oz bottle of beer (355 mL), one 5 oz glass of wine (148 mL), or one 1 oz glass of hard liquor (44 mL). Do not use any products that contain nicotine or tobacco. These products include cigarettes, chewing tobacco, and vaping devices, such as e-cigarettes. If you need help quitting, ask your health care provider. Activity  Follow a regular exercise program to stay fit. This will help you maintain your balance. Ask your health care provider what types of exercise are appropriate for you. If you need a cane or walker, use it as recommended by your health care provider. Wear supportive shoes that have nonskid soles. Safety  Remove any tripping hazards, such as rugs, cords, and clutter. Install safety equipment such as grab bars in bathrooms and safety rails on stairs. Keep rooms and walkways well-lit. General instructions Talk with your health care provider about your risks for falling. Tell your health care provider if: You fall. Be sure to tell your health care provider about all falls, even ones that seem minor. You feel dizzy, tiredness (fatigue), or off-balance. Take over-the-counter and prescription medicines only as told by your health care provider. These include supplements. Eat a healthy diet and maintain a healthy weight. A healthy diet includes low-fat dairy products, low-fat (lean) meats, and fiber from whole grains, beans, and lots of fruits and vegetables. Stay current with your vaccines. Schedule regular health, dental, and eye exams. Summary Having a healthy lifestyle and getting preventive care can help to protect your health and wellness after age 27. Screening and testing are the best way to find a health problem early and help you avoid having a fall. Early diagnosis and treatment give you the best chance for managing medical conditions that are more common for people who are older than age 22. Falls are a major cause of broken bones and  head injuries in people who are older than age 47. Take precautions to prevent a fall at home. Work with your health care provider to learn what changes you can make to improve your health and wellness and to prevent falls. This information is not intended to replace advice given to you by your health care provider. Make sure you discuss any questions you have with your health care provider. Document Revised: 06/16/2020 Document Reviewed: 06/16/2020 Elsevier Patient Education  2024 Elsevier Inc.    Maryagnes Small, MD [facility]

## 2023-07-11 NOTE — Assessment & Plan Note (Signed)
Nonobstructive coronary artery disease No aspirin given chronic anemia and thrombocytopenia and history of GI bleed

## 2023-07-11 NOTE — Assessment & Plan Note (Signed)
Stable and asymptomatic.  No concerns. Continues omeprazole 40 mg daily as needed

## 2023-07-11 NOTE — Assessment & Plan Note (Signed)
 Stable.  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 Continue tamsulosin  0.4 mg daily Still complaining of poor flow. Also taking finasteride  5 mg daily Recommend to increase tamsulosin  to 0.8 daily

## 2023-07-11 NOTE — Assessment & Plan Note (Signed)
 Stable and unchanged 10 mm nodule right lower lobe Seen on recent CT chest May 2025

## 2023-07-12 LAB — TISSUE TRANSGLUTAMINASE, IGA: (tTG) Ab, IgA: <1 U/mL

## 2023-07-20 ENCOUNTER — Ambulatory Visit: Payer: PRIVATE HEALTH INSURANCE | Admitting: Emergency Medicine

## 2023-07-20 ENCOUNTER — Ambulatory Visit: Payer: Medicare Other | Admitting: Emergency Medicine

## 2023-07-27 ENCOUNTER — Ambulatory Visit: Payer: PRIVATE HEALTH INSURANCE | Admitting: Emergency Medicine

## 2023-07-27 ENCOUNTER — Ambulatory Visit: Payer: PRIVATE HEALTH INSURANCE | Admitting: Acute Care

## 2023-08-02 ENCOUNTER — Other Ambulatory Visit (HOSPITAL_COMMUNITY): Payer: Self-pay

## 2023-08-08 ENCOUNTER — Ambulatory Visit: Payer: PRIVATE HEALTH INSURANCE | Admitting: Internal Medicine

## 2023-08-10 ENCOUNTER — Ambulatory Visit: Payer: PRIVATE HEALTH INSURANCE | Admitting: Acute Care

## 2023-09-08 ENCOUNTER — Telehealth: Payer: Self-pay | Admitting: Pulmonary Disease

## 2023-09-08 DIAGNOSIS — R911 Solitary pulmonary nodule: Secondary | ICD-10-CM

## 2023-09-08 NOTE — Telephone Encounter (Signed)
 Patient needs updated CT CHEST WO CONTRAST order with new provider due to [facility] leaving. Thank you!

## 2023-09-09 NOTE — Telephone Encounter (Signed)
 Ct ordered and sent to Landry Ferrari, NP as she is APP of the day. NFN

## 2023-10-05 NOTE — Telephone Encounter (Signed)
 Patient scheduled. NFN

## 2023-11-09 ENCOUNTER — Other Ambulatory Visit: Payer: Self-pay

## 2023-11-09 ENCOUNTER — Telehealth: Payer: Self-pay

## 2023-11-09 MED ORDER — SERTRALINE HCL 50 MG PO TABS: 50.0000 mg | ORAL_TABLET | Freq: Every day | ORAL | 1 refills | Status: AC

## 2023-11-09 MED ORDER — SERTRALINE HCL 50 MG PO TABS: 50.0000 mg | ORAL_TABLET | Freq: Every day | ORAL | 1 refills | Status: DC

## 2023-11-09 NOTE — Telephone Encounter (Signed)
 Copied from CRM #[id number]. Topic: General - Other >> Nov 09, 2023 11:23 AM Rosina BIRCH wrote: Reason for CRM: patient wife called stating the patient would like to come and pick up a paper copy script this afternoon for sertraline  for 90 day tablets with three refills. Patient is going out of town this afternoon [phone]

## 2023-11-11 NOTE — Telephone Encounter (Signed)
 Paper script was given to patient

## 2023-11-21 ENCOUNTER — Telehealth: Payer: Self-pay

## 2023-11-21 NOTE — Telephone Encounter (Signed)
 Copied from CRM [id number]. Topic: Appointments - Scheduling Inquiry for Clinic >> Nov 21, 2023  9:56 AM Mesmerise C wrote: Reason for CRM: Patient has an appointment on 07/11/2024 for a physical but it was scheduled as an office visit patient called to set up an awv with it I tried to reschedule the physical appt correctly with awv but showed that medicare doesn't cover for physicals anymore advised to the patient can set up for awv patient was upset stated his appt on 6/3 is for a physical and he still wants to be seen that day as planned, patient disconnected the line afterwards

## 2023-11-25 ENCOUNTER — Other Ambulatory Visit: Payer: Self-pay | Admitting: Cardiology

## 2023-11-25 DIAGNOSIS — I10 Essential (primary) hypertension: Secondary | ICD-10-CM

## 2023-11-28 MED ORDER — ATENOLOL 25 MG PO TABS: 25.0000 mg | ORAL_TABLET | Freq: Every day | ORAL | 0 refills | Status: AC

## 2024-06-13 ENCOUNTER — Other Ambulatory Visit: Payer: PRIVATE HEALTH INSURANCE

## 2024-07-11 ENCOUNTER — Ambulatory Visit: Payer: PRIVATE HEALTH INSURANCE | Admitting: Emergency Medicine
